# Patient Record
Sex: Female | Born: 1955 | Race: Black or African American | Hispanic: No | Marital: Single | Smoking: Current every day smoker
Health system: Southern US, Community
[De-identification: ages and names within clinical notes are randomized; demographics above are authoritative.]

## PROBLEM LIST (undated history)

## (undated) DIAGNOSIS — F419 Anxiety disorder, unspecified: Secondary | ICD-10-CM

## (undated) DIAGNOSIS — O24419 Gestational diabetes mellitus in pregnancy, unspecified control: Secondary | ICD-10-CM

## (undated) DIAGNOSIS — R5383 Other fatigue: Secondary | ICD-10-CM

## (undated) DIAGNOSIS — G43909 Migraine, unspecified, not intractable, without status migrainosus: Secondary | ICD-10-CM

## (undated) HISTORY — DX: Other fatigue: R53.83

## (undated) HISTORY — DX: Anxiety disorder, unspecified: F41.9

## (undated) HISTORY — DX: Gestational diabetes mellitus in pregnancy, unspecified control: O24.419

## (undated) HISTORY — DX: Migraine, unspecified, not intractable, without status migrainosus: G43.909

---

## 1998-12-08 ENCOUNTER — Other Ambulatory Visit: Payer: Self-pay | Admitting: Family Medicine

## 2001-12-06 ENCOUNTER — Encounter: Admitting: Family Medicine

## 2001-12-06 ENCOUNTER — Ambulatory Visit

## 2004-01-31 ENCOUNTER — Emergency Department: Payer: Self-pay | Admitting: Internal Medicine

## 2004-06-24 ENCOUNTER — Emergency Department: Payer: Self-pay | Admitting: Emergency Medicine

## 2005-03-12 ENCOUNTER — Other Ambulatory Visit: Payer: Self-pay | Admitting: Internal Medicine

## 2006-04-21 ENCOUNTER — Emergency Department: Payer: Self-pay | Admitting: Unknown Physician Specialty

## 2006-05-17 ENCOUNTER — Other Ambulatory Visit: Payer: Self-pay

## 2006-05-17 ENCOUNTER — Emergency Department: Payer: Self-pay | Admitting: Emergency Medicine

## 2006-09-09 ENCOUNTER — Emergency Department: Payer: Self-pay | Admitting: Emergency Medicine

## 2008-06-26 ENCOUNTER — Ambulatory Visit

## 2008-07-02 ENCOUNTER — Ambulatory Visit: Admitting: Family Medicine

## 2008-07-02 VITALS — BP 114/78 | HR 72 | Temp 98.2°F | Wt 149.0 lb

## 2008-07-02 MED ORDER — FLUOXETINE 20 MG TABLET: 1.0000 | ORAL_TABLET | Freq: Every day | ORAL | Status: DC

## 2008-07-02 NOTE — Nursing Note (Signed)
>>   Gevena Cotton, MA     Tue Jul 02, 2008 11:38 AM  Chief complaint noted,vital signs taken, allergies, screened for pain, med hx taken.   Tami N. Clouse, CMA

## 2008-07-02 NOTE — Progress Notes (Signed)
Chief Complaint   Patient presents with    Establish Care       Subjective:   Leah Campbell is a 53yr old female who presents for follow up establish    She  states that since onset few  year(s) ago, the symptoms are rapidly improving.  Associated symptoms include mild depression and fatigue has responded well to prozac.  Returned from [place] after 5 year transfer ;  Husband with multiple medical issues She has had similar symptoms in the past.    Menopausal symptoms doing well with hormones as per gynecologist   Needs refill    ROS:    Review of Systems -    Constitutional: negative.  Ears, Nose, Mouth, Throat: negative.  CV: negative.  Resp: negative.  GI: negative.  GU: negative.  Musculoskeletal: negative.  Endo: negative.  Derm:  negative      History:  No past medical history on file.    Objective: Physical exam   BP 114/78  Pulse 72  Temp(Src) 36.8 C (98.2 F) (Tympanic)  Wt 67.586 kg (149 lb)  General Appearance: Well nourished well hydrated patient   Alert, oriented and smiling  Eyes:  conjunctivae and corneas clear. PERRL, EOM's intact.   ENT: normal  Neck:  Neck supple. No adenopathy, thyroid symmetric, normal size,   Heart:  normal rate and regular rhythm, no murmurs, clicks, or gallops.  Lungs: clear to auscultation   Extremities:  no cyanosis, clubbing, or edema.  Skin:  negative.    Assessment:   627.9C Menopausal and postmenopausal disorder  (primary encounter diagnosis)  300.00E Anxiety     PLAN:   Orders Placed This Encounter    Estradiol-drospirenone (angeliq) 1-0.5 mg po tablet    Medroxyprogesterone (provera) 10 mg po tablet    Fluoxetine (prozac) 20 mg po tablet             History reviewed and updated.  Barriers to Learning assessed: none.  Patient verbalizes understanding of teaching and instructions.  An after visit summary was provided to the patient. The patient was instructed in all aspects of care and expressed comprehension.  Griffin Dakin, M.D.  [facility] [place]  [facility]

## 2008-07-03 ENCOUNTER — Encounter: Payer: Self-pay | Admitting: Family Medicine

## 2008-07-03 DIAGNOSIS — F419 Anxiety disorder, unspecified: Secondary | ICD-10-CM

## 2008-07-03 DIAGNOSIS — N959 Unspecified menopausal and perimenopausal disorder: Secondary | ICD-10-CM | POA: Insufficient documentation

## 2008-07-03 HISTORY — DX: Anxiety disorder, unspecified: F41.9

## 2008-12-06 ENCOUNTER — Telehealth: Payer: Self-pay | Admitting: Family Medicine

## 2008-12-06 MED ORDER — CYCLOBENZAPRINE 10 MG TABLET
ORAL_TABLET | ORAL | Status: AC
Start: 2008-12-06 — End: 2009-06-04

## 2008-12-06 NOTE — Telephone Encounter (Signed)
Patient states she was riding her bike yesterday and she thinks she may have pulled a muscle in her tail bone area. She is requesting a muscle relaxant. Offered appointment, patient declined. Requests to be sent to local pharmacy.    Leah Campbell [employer]

## 2008-12-06 NOTE — Telephone Encounter (Signed)
Nothing available , do you want to overbook?

## 2008-12-06 NOTE — Telephone Encounter (Signed)
Tami please call patient -    Flexeril faxed to pharmacy   Doc Kaylyn Lim

## 2008-12-06 NOTE — Telephone Encounter (Signed)
Patient needs to be evaluated to receive medication.

## 2008-12-06 NOTE — Telephone Encounter (Addendum)
I spoke with the patient about this.

## 2009-01-26 ENCOUNTER — Encounter: Payer: Self-pay | Admitting: Family Medicine

## 2009-03-04 ENCOUNTER — Ambulatory Visit

## 2009-03-06 ENCOUNTER — Encounter

## 2009-03-06 ENCOUNTER — Other Ambulatory Visit: Payer: Self-pay | Admitting: Nurse Practitioner

## 2009-03-06 ENCOUNTER — Encounter: Payer: Self-pay | Admitting: Family Medicine

## 2009-03-06 ENCOUNTER — Ambulatory Visit: Admitting: Family Medicine

## 2009-03-06 NOTE — Nursing Note (Signed)
>>   Gevena Cotton, MA     Thu Mar 06, 2009 11:09 AM  Chief complaint noted,vital signs taken, allergies, screened for pain, med hx taken.   Tami N. Clouse, CMA

## 2009-03-06 NOTE — Progress Notes (Signed)
Chief Complaint   Patient presents with    Fatigue     intermittent swelling around ankles x 6 weeks    Arm Problem     R-arm pain w/use         Subjective:   Leah Campbell is a 54yr old female who presents for a new problem of workout  With swelling and weight watchers. Water intake poor    She  states that since onset few week day(s) ago, the symptoms are gradually worsening.  Associated symptoms include fatigue, mild swelling lower extremity and episodes of lightheaded and dizziness .  She has not had similar symptoms in the past.    ROS:    Review of Systems -    Constitutional: negative.  Ears, Nose, Mouth, Throat: negative.  CV: negative.  Resp: negative.  GI: negative.  GU: negative.  Musculoskeletal: negative.  Endo: negative.  Derm:  negative      History:  Past Medical History:    FATIGUE                                                       Objective: Physical exam   BP 140/78  Pulse 78  Temp(Src) 36.3 C (97.4 F) (Tympanic)  General Appearance: Well nourished well hydrated patient   Alert, oriented and smiling  Eyes:  conjunctivae and corneas clear. PERRL, EOM's intact.   ENT: normal  Neck:  Neck supple. No adenopathy, thyroid symmetric, normal size,   Heart:  normal rate and regular rhythm, no murmurs, clicks, or gallops.  Lungs: clear to auscultation   Extremities:   Right elbow 2+ tender lateral epicondyle    FROM, no instability, pain with resisted extension of wrist.   Skin:  Minimal edema lower extremities    Assessment:   780.79B Fatigue  726.32AG Epicondylitis, lateral (tennis elbow)  251.2T Hypoglycemia     PLAN:   Eat 4-5 small meals per day to avoid low blood sugar  Tennis elbow strap    History reviewed and updated.  Barriers to Learning assessed: none.  Patient verbalizes understanding of teaching and instructions.  An after visit summary was provided to the patient. The patient was instructed in all aspects of care and expressed comprehension.    Electronically signed by   Billey Chang.  Kaylyn Lim, M.D.  [place] [facility] [employer]

## 2009-03-09 DIAGNOSIS — R5383 Other fatigue: Secondary | ICD-10-CM | POA: Insufficient documentation

## 2009-03-09 DIAGNOSIS — E162 Hypoglycemia, unspecified: Secondary | ICD-10-CM | POA: Insufficient documentation

## 2009-03-09 DIAGNOSIS — M7710 Lateral epicondylitis, unspecified elbow: Secondary | ICD-10-CM | POA: Insufficient documentation

## 2009-03-09 NOTE — Patient Instructions (Signed)
Index Spanish version Related topics   Low Blood Sugar (Hypoglycemia)   What is hypoglycemia?   Hypoglycemia means low blood sugar. It is usually a side effect of diabetes treatment. It can also result from other diseases or medicines, hormone or enzyme deficiencies, or tumors. A blood sugar lower than 70 that is not treated can be very dangerous. Sometimes hypoglycemia is called an insulin reaction or insulin shock.   How does it occur?   If you have diabetes and you have too much insulin or other diabetes medicine in your blood, your blood sugar level will become too low. Some other causes of abnormally low blood sugar levels are:    exercising more than usual    skipping or delaying meals or snacks    having a meal or snack that is too small    not taking medicines at the right time    drinking too much alcohol    diarrhea or vomiting.   Hypoglycemia is usually a side effect of diabetes treatment, but it can result from other medical conditions.   What are the symptoms?   The symptoms of low blood sugar range from mild to severe. Watch for the following symptoms that result from low blood sugar:    mild symptoms    dizziness    irritability    hunger    clumsiness    shakiness    sweating    fast heartbeat    moderate symptoms    confusion    headache    poor coordination    severe symptoms    seizures    unconsciousness    coma    death.   You must watch your blood sugar level closely. If you test your blood sugar regularly, you will be able to treat hypoglycemia before it causes serious symptoms.   Some high blood pressure medicines called beta blockers hide the symptoms of hypoglycemia. If you are taking medicine for high blood pressure, talk to your health care provider about this.   You should know the difference between the symptoms of low blood sugar (hypoglycemia) and high blood sugar (hyperglycemia). High blood sugar doesn't always cause symptoms, but when it does the symptoms may  include blurry vision, extreme thirst, and a lot of urination.   How is it treated?   If you often have symptoms of hypoglycemia, you should see your health care provider. Your provider can help you determine the cause. Your provider will also give you guidelines for treating low blood sugar when you are having symptoms. Here are some examples of such recommendations.   Always carry some form of sugar you can eat as soon as you have any symptoms of hypoglycemia. If you have mild or moderate hypoglycemia:    The following amounts and types of foods will bring your blood sugar level up:    1/2 cup [place] juice    1/3 cup apple juice    1/4 to 1/3 cup of raisins    several pieces of hard candy    4 to 6 ounces of regular soda (about half a can)    a tube of glucose in gel form (such as InstaGel or MonGel) or cake icing    1 tablespoon of molasses, corn syrup, or honey.    If you still have symptoms 10 to 15 minutes after eating or drinking one of the foods listed above, you may need to eat or drink another portion.    If  you are about to eat a meal, eat the fruit or drink the juice first and then eat the rest of your meal.   Fifteen to 20 minutes after treating low blood sugar, test your blood sugar level again.   If you have severe hypoglycemia that is causing seizures or unconsciousness, someone should call 911 because you need emergency treatment. Your blood sugar level will be checked and you will be given a shot of glucose or a hormone called glucagon to raise your blood sugar. You may need to go to the hospital so your health care provider can watch your reaction to treatment, determine why you had severe hypoglycemia, and, if necessary, change your medicine dosages.   How long will the effects last?   The effects of low blood sugar will continue and may even get worse until treatment restores your blood sugar level to normal. It may take several minutes after you start treatment for the symptoms to go  away. You will need to take special care the rest of your life to keep your blood sugar at the proper level.   How can I take care of myself?    Keep your blood sugar in the normal range. Check your blood sugar level regularly and whenever you have any of the symptoms of hypoglycemia. Know when to check your blood sugar and when to call for help. Ask your health care provider for guidelines to help you know when to call for help.    Carry sugar or hard candy to eat if your blood sugar gets too low.    Carry an ID (such as a card or bracelet) that says you have diabetes, in case of an emergency.    Be careful not to drive when your blood sugar is low. Driving with a low blood sugar is very dangerous, both for you and for others. The effect of a low blood sugar on reflexes and your ability to react are similar to those of a person driving while under the influence of alcohol. Always keep a quick source of sugar with you. Pull over to the side of the road right away if you begin to feel symptoms of low blood sugar and take your emergency sugar. Do not try to treat low blood sugar while you are driving.    If you are taking insulin, discuss with your health care provider whether you should carry the medicine glucagon with you at all times. A family member or friend can be taught how to inject it into your muscle if you become unconscious. After they give you the shot, they should call 911. The glucagon should raise your blood sugar enough for you to become conscious in a few minutes. Then, when you are awake enough, you can eat or drink something sweet, such as [place] juice. If you have an episode of unconsciousness from hypoglycemia, you may need to see your provider to determine why you developed hypoglycemia.   How can I prevent insulin-reaction hypoglycemia?    Check your blood sugar regularly.    Know what causes low blood sugar.    Eat a full meal at regular mealtimes. Do not delay or skip meals and do not  eat partial meals.    Take all medicines exactly as prescribed.    Check your blood sugar more often when you are exercising more or eating less, or when you are sick, according to your health care provider's recommendations.   Developed by Thana Ates Excell Seltzer,  RN, [place], and [employer].   Published by [employer].   Last modified: 2004-05-01   Last reviewed: 2004-03-18   This content is reviewed periodically and is subject to change as new health information becomes available. The information is intended to inform and educate and is not a replacement for medical evaluation, advice, diagnosis or treatment by a healthcare professional.   Adult Health Advisor 2006.2 Index  Adult Health Advisor 2006.2 Credits   Copyright  [employer] and/or one of [employer]. All Rights Reserved.     Index Spanish version Illustration       Lateral Epicondylitis (Tennis Elbow)   What is lateral epicondylitis (tennis elbow)?   Lateral epicondylitis (tennis elbow) is the name for a condition in which the bony bump at the outer side of the elbow is painful and tender.   The elbow joint is made up of the bone in the upper arm (humerus) and one of the bones in the lower arm (ulna). The bony bumps at the bottom of the humerus are called epicondyles. The bump on the outer side of the elbow, to which certain forearm muscles are attached by tendons, is called the lateral epicondyle.   Lateral epicondylitis is also referred to as wrist extensor tendonitis.   How does it occur?   Tennis elbow results from overusing the muscles in your forearm that straighten and raise your hand and wrist. When these muscles are overused, the tendons are repeatedly tugged at the point of attachment (the lateral epicondyle). As a result, the tendons become inflamed. Repeated, tiny tears in the tendon tissue cause pain. Among the activities that can cause tennis elbow are tennis and other racket sports,  carpentry, machine work, typing, and Archivist.   What are the symptoms?   The symptoms of tennis elbow are:    pain or tenderness on the outer side of the elbow    pain when you straighten or raise your wrist and hand    pain made worse by lifting a heavy object    pain when you make a fist, grip an object, shake hands, or turn door handles    pain that shoots from the elbow down into the forearm or up into the upper arm.   How is it diagnosed?   Your health care provider will ask you about your daily and recreational activities. He or she will examine your elbow and arm and will have you do movements that may cause pain in the outer part of your elbow. You may have x-rays of the elbow.   How is it treated?   Treatment includes the following:    Put an ice pack on your elbow for 20 to 30 minutes every 3 to 4 hours for 2 to 3 days or until the pain goes away.    You can also do ice massage. Massage your elbow with ice by freezing water in a Styrofoam cup. Peel the top of the cup away to expose the ice and hold onto the bottom of the cup while you rub the ice over your elbow for 5 to 10 minutes.    Wear a tennis elbow strap. This strap wraps around the forearm below the elbow and helps keep the forearm muscles from pulling on the painful epicondyle.    Take anti-inflammatory pain medicine, such as ibuprofen (Advil).    Do the exercises recommended by your health care provider. Your provider may also recommend physical therapy.  Your provider may recommend an injection of a corticosteroid medicine around the lateral epicondyle to reduce the inflammation.    In severe cases, surgery may be recommended.   You will need to avoid or reduce racket sports or other activities that involve repetitive motion of the elbow (hammering, unscrewing jars, or using a screwdriver) until your symptoms go away. Try to lift objects with your palm facing up to keep from overusing your lateral epicondyle.   How long will the  effects last?   The length of recovery depends on many factors such as your age and health, and if you have had a previous injury. Recovery time also depends on the severity of the injury. A mild injury may recover within a few weeks, whereas a severe injury may take 6 weeks or longer to recover. This problem can sometimes be long-lasting and can even come back once you are better. You need to stop doing the activities that cause pain until your elbow has healed. If you continue doing activities that cause pain, your symptoms will return and it will take longer to recover.   When can I return to my normal activities?   Everyone recovers from an injury at a different rate. Return to your activities will be determined by how soon your elbow recovers, not by how many days or weeks it has been since your injury has occurred. In general, the longer you have symptoms before you start treatment, the longer it will take to get better. The goal of rehabilitation is to return you to your normal activities as soon as is safely possible.   You may return to your sport or activity when you are able to forcefully grip things, like a tennis racket or golf club, or do activities such as working at a keyboard without pain in your elbow. It is important that there is no swelling around your injured elbow and that it has regained its normal strength compared to your uninjured elbow. You must have full range of motion of your elbow.   How can I take care of myself?   To help take care of yourself, follow the full treatment your health care provider prescribes. In addition, you can:    Get enough sleep and avoid becoming overtired.    Avoid painful activities, including racket sports, shaking hands, hammering, unscrewing jars, or using a screwdriver.   How can I prevent tennis elbow?   To prevent tennis elbow:    Use proper form during your activities, whether they are sports or job-related. For instance, be sure your tennis stroke  is correct and that your tennis racket has the proper grip size.    Warm up before playing tennis or doing other activities that involve your elbow or arm muscles. Gently stretch your elbow and arm muscles before and after exercise.    Ice your elbow after exercise or work.    In job-related activities, be sure your posture is correct and that the position of your arms during your work doesn't cause overuse of your elbow or arm muscles.   Written by Beatriz Stallion, M.D., for [facility] [employer].   Published by [employer].   Last modified: 2004-06-09   Last reviewed: 2002-02-20   This content is reviewed periodically and is subject to change as new health information becomes available. The information is intended to inform and educate and is not a replacement for medical evaluation, advice, diagnosis or treatment by a healthcare professional.  Adult Health Advisor 2006.2 Index  Adult Health Advisor 2006.2 Credits   Copyright  [employer] and/or one of [employer]. All Rights Reserved.         Index Illustration       Lateral Epicondylitis (Tennis Elbow)   Rehabilitation Exercises   You may do the stretching exercises right away. You may do the strengthening exercises when stretching is nearly painless.   Stretching exercises    Wrist range of motion: Bend your wrist forward and backward as far as you can. Do 3 sets of 10.    Wrist stretch: With your uninjured hand, help to bend the injured wrist down by pressing the back of your hand and holding it down for 15 to 30 seconds. Next, stretch the hand back by pressing the fingers in a backward direction and holding it for 15 to 30 seconds. Keep your elbow straight during this exercise. Do 3 sets.    Pronation and supination of the forearm: With your elbow bent 90, turn your palm upward and hold for 5 seconds. Slowly turn your palm downward and hold for 5 seconds. Make sure you keep your elbow at your side and  bent 90 throughout this exercise. Do 3 sets of 10.    Elbow range of motion: Gently bring your palm up toward your shoulder and bend your elbow as far as you can. Then straighten your elbow as far as you can 10 times. Do 3 sets of 10.   Strengthening exercises    Wrist flexion exercise: Hold a can or hammer handle in your hand with your palm facing up. Bend your wrist upward. Slowly lower the weight and return to the starting position. Do 3 sets of 10. Gradually increase the weight of the can or weight you are holding.    Wrist extension exercise: Hold a soup can or hammer handle in your hand with your palm facing down. Slowly bend your wrist upward. Slowly lower the weight down into the starting position. Do 3 sets of 10. Gradually increase the weight of the object you are holding.    Wrist radial deviation strengthening: Put your wrist in the sideways position with your thumb up. Hold a can of soup or a hammer handle and gently bend your wrist up, with the thumb reaching toward the ceiling. Slowly lower to the starting position. Do not move your forearm throughout this exercise. Do 3 sets of 10.    Forearm pronation and supination strengthening: Hold a soup can or hammer handle in your hand and bend your elbow 90. Slowly rotate your hand with your palm upward and then palm down. Do 3 sets of 10.    Wrist extension (with broom handle): Stand up and hold a broom handle in both hands. With your arms at shoulder level, elbows straight and palms down, roll the broom handle backward in your hand as if you are reeling something in using a broom handle. Do 3 sets of 10.   Written by Modesta Messing, MS, PT, for [facility] [employer].   Published by [employer].   Last modified: 2004-05-18   Last reviewed: 2002-02-20   This content is reviewed periodically and is subject to change as new health information becomes available. The information is intended to inform and educate and is not a  replacement for medical evaluation, advice, diagnosis or treatment by a healthcare professional.   Sports Medicine Advisor 2006.2 Index  Sports Medicine Advisor 2006.2 Credits   Copyright  2006 [employer] and/or one of [employer] All Rights Reserved.

## 2009-04-28 ENCOUNTER — Encounter: Payer: Self-pay | Admitting: Family Medicine

## 2009-04-28 ENCOUNTER — Ambulatory Visit: Admitting: Family Medicine

## 2009-04-28 ENCOUNTER — Ambulatory Visit

## 2009-04-28 VITALS — BP 136/74 | HR 96 | Wt 150.8 lb

## 2009-04-28 NOTE — Nursing Note (Signed)
>>   Gevena Cotton, MA     Mon Apr 28, 2009  4:22 PM  Chief complaint noted,vital signs taken, allergies, screened for pain, med hx taken.   Tami N. Clouse, CMA

## 2009-04-28 NOTE — Progress Notes (Signed)
Chief Complaint   Patient presents with    Irregular Heart Beat     Patient states she is having irregular heartbeat. Patient denies chest pain,nausea/vomiting,jaw or L-arm pain.         Subjective:   Leah Campbell is a 53yr old female who presents for a new problem of palpitations.    She  states that since onset few week(s) ago, the symptoms are gradually worsening.  Associated symptoms include palpitations;  Husband with a recurrence of papillary thyroid cancer.  She has had similar symptoms in the past.  Patient denies any exertional chest pain, dyspnea, palpitations, syncope, orthopnea, edema or paroxysmal nocturnal dyspnea.      ROS:    Review of Systems -    Constitutional: negative.  Ears, Nose, Mouth, Throat: negative.  CV: palpitations  Resp: negative.    History:  Past Medical History:    FATIGUE                                                       Objective: Physical exam   BP 172/90  Pulse 96  Wt 68.402 kg (150 lb 12.8 oz)  BP 136/74 by me   General Appearance: Well nourished well hydrated patient   Alert, oriented and smiling  Eyes:  conjunctivae and corneas clear. PERRL, EOM's intact.   ENT: normal  Neck:  Neck supple. No adenopathy, thyroid symmetric, normal size,   Heart:  normal rate and regular rhythm, no murmurs, clicks, or gallops.  Lungs: clear to auscultation   EKG RSR    Assessment:   785.1 Palpitations  (primary encounter diagnosis)  796.2A Transient hypertension     PLAN:   Echocardiogram and holter monitor  Monitor blood pressure Goal BP < or = 120/80        History reviewed and updated.  Barriers to Learning assessed: none.  Patient verbalizes understanding of teaching and instructions.  An after visit summary was provided to the patient. The patient was instructed in all aspects of care and expressed comprehension.    Electronically signed by   Billey Chang. Kaylyn Lim, M.D.  [place] [facility] [employer]

## 2009-05-03 ENCOUNTER — Encounter: Payer: Self-pay | Admitting: Family Medicine

## 2009-05-03 DIAGNOSIS — R002 Palpitations: Secondary | ICD-10-CM | POA: Insufficient documentation

## 2009-11-20 ENCOUNTER — Ambulatory Visit

## 2009-11-21 ENCOUNTER — Encounter: Payer: Self-pay | Admitting: Family Medicine

## 2009-11-21 ENCOUNTER — Ambulatory Visit: Admitting: Family Medicine

## 2009-11-21 VITALS — BP 120/60 | HR 84 | Temp 98.6°F | Wt 147.0 lb

## 2009-11-21 MED ORDER — PREDNISONE 10 MG TABLET
10.0000 mg | ORAL_TABLET | Freq: Every day | ORAL | Status: DC
Start: 2009-11-21 — End: 2010-05-27

## 2009-11-21 MED ORDER — ALBUTEROL SULFATE 2.5 MG/3 ML (0.083 %) SOLUTION FOR NEBULIZATION
2.5000 mg | INHALATION_SOLUTION | Freq: Four times a day (QID) | RESPIRATORY_TRACT | Status: AC | PRN
Start: 2009-11-21 — End: 2010-11-21

## 2009-11-21 MED ORDER — ALBUTEROL SULFATE HFA 90 MCG/ACTUATION AEROSOL INHALER
2.0000 | INHALATION_SPRAY | RESPIRATORY_TRACT | Status: DC | PRN
Start: 2009-11-21 — End: 2013-03-23

## 2009-11-21 MED ORDER — FLUTICASONE 220 MCG/ACTUATION AEROSOL ORAL INHALER
1.0000 | INHALATION_SPRAY | Freq: Two times a day (BID) | RESPIRATORY_TRACT | Status: AC
Start: 2009-11-21 — End: 2010-11-21

## 2009-11-21 MED ORDER — AZITHROMYCIN 250 MG TABLET
ORAL_TABLET | ORAL | Status: AC
Start: 2009-11-21 — End: 2009-11-26

## 2009-11-21 NOTE — Patient Instructions (Addendum)
Flovent:  Start with the albuterol 2 puffs in morning and evening wait 5-10 minutes then use the Flovent  inhaler 1 puffs three times a day and   rinse mouth with water after using.  After 5-7 days gradually reduce the puffs to 1 puff three times a day, the twice a day, then once a day.  If your cough or wheezing flare then up the dosage  You may use the albuterol (rescue inhaler) every 4 hours for wheezing or shortness of breath as necessary.  As the steroid inhaler gradually relieves the inflammation you will not need the albuterol inhaler as much because you will be breathing easier.  Index Spanish version

## 2009-11-21 NOTE — Progress Notes (Signed)
Chief Complaint   Patient presents with    Cough     fever 101.5,chest congestion x 2 days. Pt taking advil and mucinex      SUBJECTIVE:   Leah Campbell is a 24yr female who complains of sinus and nasal congestion, sore throat, nasal blockage, post nasal drip, fever, chest congestion, wheezing and dry cough for 2-3 days. She denies a history of sweats and denies a history of asthma. Patient denies smoke cigarettes.     OBJECTIVE:  She appears well, vital signs are as noted by the nurse. Ears normal.  Throat and pharynx normal.  Neck supple. No adenopathy in the neck. Nose is congested. Sinuses non tender. The chest with moderate end expiratory wheezes     ASSESSMENT:   493.90A Asthmatic bronchitis  (primary encounter diagnosis)     PLAN:  Orders Placed This Encounter    ALBUTEROL NEB TX 2.5MG  IN NS    Azithromycin (ZITHROMAX Z-PAK) 250 mg Tablet    Albuterol (PROAIR HFA, PROVENTIL HFA, VENTOLIN HFA) 90 mcg/Actuation inhaler    Fluticasone (FLOVENT HFA) 220 mcg/actuation Inhaler    PredniSONE (DELTASONE) 10 mg Tab Tablet    Albuterol (PROVENTIL, VENTOLIN) 2.5 mg /3 mL (0.083 %) nebulization         Symptomatic therapy suggested: push fluids, rest, use vaporizer or mist prn and ROV prn if symptoms persist or worsen. Call or return to clinic prn if these symptoms worsen or fail to improve as anticipated.    History reviewed and updated.  Barriers to Learning assessed: none.  Patient verbalizes understanding of teaching and instructions.  An after visit summary was provided to the patient. The patient was instructed in all aspects of care and expressed comprehension.    Electronically signed by   Billey Chang. Kaylyn Lim, M.D.   - [employer]

## 2009-11-21 NOTE — Nursing Note (Signed)
>>   Gevena Cotton, MA     Fri Nov 21, 2009 10:35 AM  Nebulizer treatment given per Dr. Arlyss Queen orders.    >> Gevena Cotton, [place]     Fri Nov 21, 2009  9:48 AM  Chief complaint noted,vital signs taken, allergies, screened for pain, med hx taken.   Tami N. Clouse, CMA

## 2009-11-23 DIAGNOSIS — J45909 Unspecified asthma, uncomplicated: Secondary | ICD-10-CM | POA: Insufficient documentation

## 2010-03-27 ENCOUNTER — Telehealth: Payer: Self-pay | Admitting: Family Medicine

## 2010-03-27 MED ORDER — OSELTAMIVIR 75 MG CAPSULE
75.0000 mg | ORAL_CAPSULE | Freq: Every day | ORAL | Status: AC
Start: 2010-03-27 — End: 2010-04-06

## 2010-03-27 NOTE — Telephone Encounter (Signed)
Lm to pt's vm regarding MD's message below

## 2010-03-27 NOTE — Telephone Encounter (Signed)
Patient does not have any symptoms. Prophylactic Tamiflu prescription sent.

## 2010-03-27 NOTE — Telephone Encounter (Signed)
Patient is calling states her husband was seen by Dr. Bobby Rumpf today for his flu and Dr. Bobby Rumpf prescribed Tamiflu for him and she would like Dr. Bobby Rumpf to call in Prescription for her as well.

## 2010-05-25 ENCOUNTER — Ambulatory Visit

## 2010-05-27 ENCOUNTER — Encounter: Payer: Self-pay | Admitting: Family Medicine

## 2010-05-27 ENCOUNTER — Ambulatory Visit: Admitting: Family Medicine

## 2010-05-27 MED ORDER — OLOPATADINE 0.1 % EYE DROPS
1.0000 [drp] | Freq: Two times a day (BID) | OPHTHALMIC | Status: AC
Start: 2010-05-27 — End: 2011-05-27

## 2010-05-27 NOTE — Progress Notes (Signed)
Chief Complaint   Patient presents with    Eye Problem     discharge,blury,itching,nasal congestion,chest congestion      SUBJECTIVE:   Leah Campbell is a 52yr female who complains of sinus and nasal congestion, sore throat, nasal blockage, post nasal drip, itching in eyes and clear nasal discharge for few days. She denies a history of fevers and has a history of asthma. Patient denies smoke cigarettes.     OBJECTIVE:  She appears well, vital signs are as noted by the nurse. Ears normal.  Throat and pharynx normal.  Neck supple. No adenopathy in the neck. Nose is congested. Sinuses non tender. The chest is clear, without wheezes or rales.    ASSESSMENT:    Allergic conjunctivitis   Allergic rhinitis   Asthma     PLAN:  Orders Placed This Encounter    Olopatadine (PATANOL) 0.1 % Ophthalmic Solution       Symptomatic therapy suggested: push fluids, rest, use vaporizer or mist prn and ROV prn if symptoms persist or worsen. Call or return to clinic prn if these symptoms worsen or fail to improve as anticipated.

## 2010-05-27 NOTE — Nursing Note (Signed)
>>   Gevena Cotton, MA     Wed May 27, 2010  2:03 PM  Chief complaint noted,vital signs taken, allergies, screened for pain, med hx taken.   Tami N. Clouse, CMA

## 2010-07-16 ENCOUNTER — Other Ambulatory Visit: Payer: Self-pay | Admitting: Nurse Practitioner

## 2010-07-16 ENCOUNTER — Ambulatory Visit

## 2010-07-23 ENCOUNTER — Ambulatory Visit

## 2010-07-24 ENCOUNTER — Encounter: Payer: Self-pay | Admitting: Family Medicine

## 2010-07-24 ENCOUNTER — Ambulatory Visit: Admitting: Family Medicine

## 2010-07-24 VITALS — BP 126/80 | HR 66

## 2010-07-24 NOTE — Nursing Note (Signed)
>>   Gevena Cotton, MA     Fri Jul 24, 2010  2:48 PM  Chief complaint noted,vital signs taken, allergies, screened for pain, med hx taken.   Tami N. Clouse, CMA

## 2010-07-28 NOTE — Progress Notes (Signed)
Chief Complaint   Patient presents with    Test Results       Subjective:   Leah Campbell is a 55yr old female who presents for follow up of anemia mild  Microscopic hematuria, low vitamin D .    She  states that since onset few month(s) ago, the symptoms are gradually improving.  Associated symptoms include fatigue, mild anemia.  She has had similar symptoms in the past.    ROS:    Review of Systems -    Constitutional: negative.  Ears, Nose, Mouth, Throat: negative.  CV: negative.  Resp: negative.  GI: negative.  GU: no flank pain  Musculoskeletal: negative.  Endo: negative.  Derm:  negative      History:  Past Medical History:    Fatigue                                                       Anxiety                                         07/03/2008     Objective: Physical exam   BP 126/80  Pulse 66  General Appearance: Well nourished well hydrated patient   Alert, oriented and smiling  Eyes:  conjunctivae and corneas clear. PERRL, EOM's intact.   ENT: normal  Neck:  Neck supple. No adenopathy, thyroid symmetric, normal size,   Heart:  normal rate and regular rhythm, no murmurs, clicks, or gallops.  Lungs: clear to auscultation   Abdomen: BS normal.  Abdomen soft, non-tender.  No masses or organomegaly.  Extremities:  no cyanosis, clubbing, or edema.  Skin:  negative.    Assessment:    Anemia  (primary encounter diagnosis)   Vitamin D deficiency   Hematuria   Special screening for malignant neoplasms, colon     PLAN:   Orders Placed This Encounter    US KIDNEY (RENAL), COMPLETE    CBC AUTO + REFLEX MANUAL DIFF    VITAMIN D, 25 HYDROXY    FERRITIN    GASTROENTEROLOGY REFERRAL            History reviewed and updated.  Barriers to Learning assessed: none.  Patient verbalizes understanding of teaching and instructions.  An after visit summary was provided to the patient. The patient was instructed in all aspects of care and expressed comprehension.    Electronically signed by   Billey Chang. Kaylyn Lim, M.D.  [place] -  [employer]

## 2010-07-30 ENCOUNTER — Ambulatory Visit: Admitting: Internal Medicine

## 2010-07-30 VITALS — BP 136/70 | HR 84 | Temp 99.6°F

## 2010-07-30 MED ORDER — AZITHROMYCIN 250 MG TABLET
ORAL_TABLET | ORAL | Status: AC
Start: 2010-07-30 — End: 2010-08-04

## 2010-07-30 NOTE — Progress Notes (Signed)
SUBJECTIVE:   Leah Campbell is a 7yr female who complains of sore throat for two days. She denies a history of nausea and vomiting and denies a history of asthma. Patient denies smoke cigarettes.  However, respiratory infection symptoms are gradually worse in the past 24 hours, developed chills, fevers. Very concerned for strep infection per patient.      OBJECTIVE:    BP 136/70  Pulse 84  Temp(Src) 37.6 C (99.6 F) (Tympanic)  She appears tired. Ears: canal clean, but TM bulging.  Oral pharynx revealed erythema with some exudates.  Neck supple. No adenopathy in the neck. The chest exam revealed no rhonchi. Heart exam: normal.     ASSESSMENT AND PLAN:   462 Acute pharyngitis  (primary encounter diagnosis)    Plan: POC RAPID STREP A negative             Counseling done. Symptoms are slightly worse, not better now. Will use antibiotic now. Benefits and risks of the use of antibiotic discussed with patient and antibiotic prescribed today. See Order Section. Symptomatic therapy suggested: push fluids, rest and ROV prn if symptoms persist or worsen. Call or return to clinic prn if these symptoms worsen or fail to improve as anticipated.    RTC as instructed.    I did review patient's past medical and family/social history and they are unchanged.   Barriers to Learning assessed: none. Patient verbalizes understanding of teaching and instructions.     Electronically Signed By:  Olene Floss, M.D.,Ph.D.  Associate Physician  [facility] [employer]  Phone: [phone]  3:34 PM

## 2010-07-30 NOTE — Nursing Note (Signed)
>>   Leah Campbell     Thu Jul 30, 2010  3:22 PM  Vital signs taken, allergies verified, screened for pain, med hx taken.  Clarita Crane, LVN

## 2010-08-12 ENCOUNTER — Ambulatory Visit: Admitting: Family Medicine

## 2010-08-12 ENCOUNTER — Encounter: Payer: Self-pay | Admitting: Family Medicine

## 2010-08-12 VITALS — BP 110/72 | HR 66 | Temp 98.4°F

## 2010-08-12 MED ORDER — AZITHROMYCIN 250 MG TABLET
ORAL_TABLET | ORAL | Status: AC
Start: 2010-08-12 — End: 2010-08-17

## 2010-08-12 NOTE — Progress Notes (Signed)
SUBJECTIVE:  Leah Campbell is a 55yr old female complains of sore throat with Griner spots x 2 days, right side only. Fatigue. Had similar symptoms 2 weeks ago. Better with zpak. No fever. No chills. No coughing. No sinus pain. No ear pain.     Past Medical History   Diagnosis Date    Fatigue     Anxiety 07/03/2008     OBJECTIVE:  General Appearance: healthy, alert, no distress, pleasant affect, cooperative.  Eyes:  conjunctivae and corneas clear. PERRL, EOM's intact. Fundi benign.  Ears:  normal TMs and canal.  Nose:  normal.  Mouth: mild tiny exudates right tonsils. Left side normal. No edema. No petechia.   No strawberry tongue.   Neck:  Neck supple. No adenopathy, thyroid symmetric, normal size.  Heart:  normal rate and regular rhythm, no murmurs, clicks, or gallops.  Lungs: clear to auscultation and percussion, no chest deformities noted.  Extremities:  no cyanosis, clubbing, or edema.  Skin:  negative.    ASSESSMENT:  462 Pharyngitis  (primary encounter diagnosis)  Comment:   Plan: POC RAPID STREP A, CULTURE BETA STREP ONLY        Zpack if not better 48 hours.         RST neg.     Barriers to Learning assessed: none. Patient verbalizes understanding of teaching and instructions.  Carlota Raspberry, DO

## 2010-08-12 NOTE — Nursing Note (Signed)
>>   Gevena Cotton, MA     Wed Aug 12, 2010  9:08 AM  Rapid strep test performed and charted per orders.    >> Gevena Cotton, [place]     Wed Aug 12, 2010  8:42 AM  Chief complaint noted,vital signs taken, allergies, screened for pain, med hx taken.   Tami N. Clouse, CMA

## 2010-08-24 ENCOUNTER — Ambulatory Visit

## 2010-08-24 ENCOUNTER — Encounter: Payer: Self-pay | Admitting: Family Medicine

## 2010-08-24 ENCOUNTER — Ambulatory Visit: Admitting: Family Medicine

## 2010-08-24 VITALS — BP 154/80 | HR 78

## 2010-08-24 NOTE — Nursing Note (Signed)
>>   Gevena Cotton, MA     Mon Aug 24, 2010  2:48 PM  Chief complaint noted,vital signs taken, allergies, screened for pain, med hx taken.   Tami N. Clouse, CMA

## 2010-08-24 NOTE — Progress Notes (Signed)
Chief Complaint   Patient presents with    Fatigue     swollen neck,headache,sore throat x 1 mo. Ongoing problem       Subjective:   Leah Campbell is a 55yr old female who presents for a new problem of sore throat.    She  states that since onset 1 month(s) ago, the symptoms are gradually worsening.  Associated symptoms include strept negative and zpack.  ? Chronically infected tonsil   Sore throat, fatigue,nausea, persistent fatigue.  Tried to work out   No fever She has not had similar symptoms in the past.    No history of fatigue husband with numerous medical issues    ROS:    Review of Systems -    Constitutional: negative.  Ears, Nose, Mouth, Throat: negative.  CV: negative.  Resp: negative.  GI: negative.  GU: negative.  Musculoskeletal: negative.  Endo: negative.  Derm:  negative      History:  Past Medical History:    Fatigue                                                       Anxiety                                         07/03/2008     Objective: Physical exam   BP 154/80  Pulse 78  General Appearance: Well nourished well hydrated patient   Alert, oriented and smiling  Eyes:  conjunctivae and corneas clear. PERRL, EOM's intact.   ENT: normal except minimal nodes  Neck:  Neck supple. No adenopathy, thyroid symmetric, normal size,   Heart:  normal rate and regular rhythm, no murmurs, clicks, or gallops.  Lungs: clear to auscultation   Abdomen: BS normal.  Abdomen soft, non-tender.  No masses or organomegaly.  No axillary nodes or inguinal node enlargement  Extremities:  no cyanosis, clubbing, or edema.  Skin:  negative.    Assessment:    Fatigue  (primary encounter diagnosis)   Cervical lymphadenitis     PLAN:  Orders Placed This Encounter    HETEROPHILE (MONO SCREEN)    EPSTEIN BARR ANTIBODY PROFILE    CBC AUTO + REFLEX MANUAL DIFF    SED RATE WESTERGREN           History reviewed and updated.  Barriers to Learning assessed: none.  Patient verbalizes understanding of teaching and instructions.  An  after visit summary was provided to the patient. The patient was instructed in all aspects of care and expressed comprehension.    Electronically signed by   Billey Chang. Kaylyn Lim, M.D.  [place] - [employer]

## 2010-08-26 ENCOUNTER — Encounter: Payer: Self-pay | Admitting: Family Medicine

## 2010-09-25 ENCOUNTER — Encounter: Payer: Self-pay | Admitting: Family Medicine

## 2010-10-28 ENCOUNTER — Ambulatory Visit

## 2010-11-04 ENCOUNTER — Ambulatory Visit: Admitting: Family Medicine

## 2010-11-20 ENCOUNTER — Telehealth: Payer: Self-pay | Admitting: Family Medicine

## 2010-11-20 NOTE — Telephone Encounter (Signed)
Husband report patient wife is anxious, panick attacks, lay in bed and sweat and sick to her stomach. Fearful. Physical and emotional symptoms. Patient feels heart racing sometimes.     Ok for Urgent care.     Carlota Raspberry, DO

## 2010-11-23 ENCOUNTER — Ambulatory Visit

## 2010-11-23 ENCOUNTER — Ambulatory Visit: Admitting: Family Medicine

## 2010-11-23 MED ORDER — CLONAZEPAM 0.25 MG DISINTEGRATING TABLET
DISINTEGRATING_TABLET | ORAL | Status: AC
Start: 2010-11-23 — End: 2011-02-23

## 2010-11-23 NOTE — Nursing Note (Signed)
>>   Maryruth Bun, MA     Mon Nov 23, 2010  2:17 PM  Vitals taken, allergies verified, screened for pain, Patient verified X2

## 2010-11-23 NOTE — Patient Instructions (Addendum)
Col[facility]   Tildon Husky, Ph.D., LCSW, [facility]  [address]  [address]  [place], [place]  [address]  ph:  [phone]  fax: [phone]    Developing and maintaining a healthy life-style will help you manage stress. Pay attention to how you react in stress-producing situations.  Think about alternatives.  To reduce the effects of stress, try some of the following techniques:  -Exercise for 30 minutes a minimum of three times a week.  -Change or eliminate upsetting situations, do things one at a time, and don't over schedule yourself.  -Take a relaxation break:  talk with friends;  listen to music you find peaceful and relaxing:  take a walk;  go to a movie;  do something new and different to break your usual behavior pattern.  -Learn to use relaxation techniques, such as mental imaging, diaphragmatic breathing, and progressive muscle relaxation.  -Get adequate, regular amounts of rest and sleep (6 to 10 hours a night).  -Eat three to six small, balanced meals a day.  -Minimize caffeine and alcohol consumption.  -Increase your fluid intake (six to eight glasses of water a day).  -Develop a positive outlook and sense of humor to overcome negative thoughts.  -Seek professional help when you are face with particularly stressful life experiences.

## 2010-11-23 NOTE — Progress Notes (Signed)
Chief Complaint   Patient presents with    Anxiety       Subjective:   Leah Campbell is a 55yr old female who presents for symptoms of  Anxiety.    She  states that since onset few week(s) ago, the symptoms are gradually worsening.  Associated symptoms include family stresses, and panic.  She has had similar symptoms in the past.  Anxiety: Overall, she has moderate symptoms that are gradually worsening. She is experiencing anxious mood and panic symptoms:(chest tightness/ choking, shortness of breath, sweats and lightheadedness), and she reports that none are improved. Psychosocial factors include: family/personal problems, ,specifically, parents need to go to home  She takes prozac works well  Side effects: none.  Overall, she is satisfied with current plan.  Unable to eat      ROS:    Review of Systems -    Constitutional: negative.  Ears, Nose, Mouth, Throat: negative.  CV: negative.  Resp: negative.  GI: negative.  GU: negative.  Musculoskeletal: negative.  Endo: negative.  Derm:  negative      History:  Past Medical History:    Fatigue                                                       Anxiety                                         07/03/2008     Objective: Physical exam   BP 140/82  Pulse 96  Wt 65.772 kg (145 lb)  General Appearance: Well nourished well hydrated patient   Alert, oriented and smiling  Eyes:  conjunctivae and corneas clear. PERRL, EOM's intact.   ENT: normal  Neck:  Neck supple. No adenopathy, thyroid symmetric, normal size,   Heart:  normal rate and regular rhythm, no murmurs, clicks, or gallops.  Lungs: clear to auscultation   Extremities:  no cyanosis, clubbing, or edema.  Skin:  negative.    Assessment:    Anxiety   Panic     PLAN:  Orders Placed This Encounter    Clonazepam (KLONOPIN) 0.25 mg Disintegrating Tablet       Restart Prozac      History reviewed and updated.  Barriers to Learning assessed: none.  Patient verbalizes understanding of teaching and instructions.  An after  visit summary was provided to the patient. The patient was instructed in all aspects of care and expressed comprehension.    Electronically signed by   Billey Chang. Kaylyn Lim, M.D.  [place] - [employer]

## 2010-11-24 ENCOUNTER — Telehealth: Payer: Self-pay | Admitting: Family Medicine

## 2010-11-24 MED ORDER — LORAZEPAM 0.5 MG TABLET
0.5000 mg | ORAL_TABLET | Freq: Two times a day (BID) | ORAL | Status: DC | PRN
Start: 2010-11-24 — End: 2013-06-22

## 2010-11-24 NOTE — Telephone Encounter (Signed)
Patient is calling states, she was prescribe with a Clonazepam (KLONOPIN) 0.25 mg Disintegrating Tablet . She tooked last night , and it didn't work for her. She is asking if she can be change to Xanax or Ativan . Please give her a call back.    Creston Klas   M[facility] II

## 2010-11-24 NOTE — Telephone Encounter (Signed)
Hold klonopin doesn't help very much  Will try ativan has used before  [facility]

## 2011-05-17 ENCOUNTER — Emergency Department: Payer: Self-pay | Admitting: Emergency Medicine

## 2011-09-06 ENCOUNTER — Emergency Department: Payer: Self-pay | Admitting: Emergency Medicine

## 2012-01-25 ENCOUNTER — Telehealth: Payer: Self-pay | Admitting: Family Medicine

## 2012-01-25 NOTE — Telephone Encounter (Signed)
Add patient on at 10:00am add on please  Doc Kaylyn Lim

## 2012-01-25 NOTE — Telephone Encounter (Signed)
Eliette fell off equipment at [employer] started having deja vu episode  Dream got off nausea unable to see   Brief loss of consciousness and head hit floor  Lab, Cat Scan of head   Doc Kaylyn Lim

## 2012-01-25 NOTE — Telephone Encounter (Signed)
Patient husband is calling states, patient was felt off the machine at the gym today, was taking to [facility] ER and diagnose with a Seizures. Patient husband said patient needs to be seen by Neurologist as soon as possible . He is asking for the referral. Please give them a call.    Leah Campbell   MOS[facility] II

## 2012-01-26 ENCOUNTER — Ambulatory Visit: Admitting: Family Medicine

## 2012-01-26 ENCOUNTER — Ambulatory Visit

## 2012-01-26 ENCOUNTER — Encounter: Payer: Self-pay | Admitting: Family Medicine

## 2012-01-26 VITALS — BP 142/72 | HR 84 | Temp 99.9°F

## 2012-01-26 NOTE — Patient Instructions (Addendum)
To check your hydration  Check your pulse after lying for 2 minutes and then stand and take pulse immediately the pulses should be the same  Check your pulse for 15 seconds then multiply times 4 = beats per minute  Or within 4 beats;  If higher then you are dehydrated  If pulse higher then need more water and electrolytes   If working or playing in the sun should replenish fluids with water and pretzels or chips or Gatorade or Powerade drinks.    Pulse lying is 80 pulse standing 92       PROCEDURE: CT BRAIN WO CONTRAST - , 01/25/2012 12:54 PM  ACCESSION NUMBER(S): [id number]  LOCATION:ERD  COMPARISON: None  CLINICAL INDICATION:S/P SYNCOPE ED LOBBY.  TECHNIQUE:  Axial noncontrast CT images were obtained from the skull base to  the vertex at 5 mm slice thickness in brain and bone algorithm.  FINDINGS:  Brain: No acute intracranial hemorrhage or large-territory  infarcts. No significant mass effect or midline shift. No  extra-axial fluid collections. The ventricles and basal cisterns  are within normal limits.  Orbits: Visualized portions are within normal limits.  Sinuses and Otomastoids:No mucosal thickening or air-fluid  levels.  Osseous Structures and Soft Tissues: Within normal limits.  IMPRESSION:  No acute intracranial findings radiographically.

## 2012-01-26 NOTE — Nursing Note (Signed)
>>   Leah Campbell     Wed Jan 26, 2012 10:16 AM  Attempted to obtain weight, patient decline    Patient roomed, chief complaint taken, allergies verified, vitals obtain, pain level noted and pharmacy verified.  Marjean Donna MA

## 2012-01-26 NOTE — Progress Notes (Signed)
Chief Complaint   Patient presents with    Other     Follow up ER visit       Subjective:   Leah Campbell is a 57yr old female who presents for follow up of faint spell yesterday.    She  states that since onset 1 day(s) ago, the symptoms are gradually improving.  Associated symptoms include   Leah Campbell fell off equipment at gymn  Elliptical started having deja vu episode  Dream got off nausea unable to see   Brief loss of consciousness and head hit floor    Lab, Cat Scan of head at [facility] .  She has not had similar symptoms in the past.    Petit mal age 57 years old    ROS:    Review of Systems -    Constitutional: negative.  Ears, Nose, Mouth, Throat: negative.  CV: negative.  Resp: negative.  GI: negative.  GU: negative.  Musculoskeletal: negative.  Endo: negative.  Derm:  negative      History:  Past Medical History:    Fatigue                                                       Anxiety                                         07/03/2008     Objective: Physical exam   BP 142/72  Pulse 84  Temp(Src) 37.7 C (99.9 F) (Tympanic)  Pulse lying is 80 pulse standing 92   General Appearance: Well nourished well hydrated patient   Alert, oriented and smiling  Eyes:  conjunctivae and corneas clear. PERRL, EOM's intact.   ENT: normal  Neck:  Neck supple. No adenopathy, thyroid symmetric, normal size,   Heart:  normal rate and regular rhythm, no murmurs, clicks, or gallops.  Lungs: clear to auscultation   Neurological Exam: normal without focal findings, mental status, speech normal, alert and oriented to person, place and time, PERLA, fundi are normal, cranial nerves 2-12 intact, muscle tone and strength normal and symmetric, reflexes normal and symmetric, sensation grossly normal, gait and station normal.  Rhomberg and tandem walk normal  Extremities:  no cyanosis, clubbing, or edema.  Skin:  negative.    Assessment:   Fainting spell  (primary encounter diagnosis)  Dehydration  Hypoglycemia   Comment:  I  do not feel patient has seizure disorder   ER doctor concerned so will order    PLAN:  Orders Placed This Encounter    EEG - ELECTROENCEPHALOGRAM, ROUTINE    Duloxetine (CYMBALTA) 60 mg capsule             History reviewed and updated.  Barriers to Learning assessed: none.  Patient verbalizes understanding of teaching and instructions.  An after visit summary was provided to the patient. The patient was instructed in all aspects of care and expressed comprehension.    Electronically signed by   Billey Chang. Kaylyn Lim, M.D.  [place] - [employer]

## 2012-01-28 ENCOUNTER — Encounter: Payer: Self-pay | Admitting: Family Medicine

## 2012-01-28 NOTE — Telephone Encounter (Signed)
From: Allyson Sabal  To: Griffin Dakin, MD  Sent: 01/27/2012 5:31 PM PST  Subject: Non-urgent Medical Advice Question    Hi Dr. Kaylyn Lim,   Is there any chance you were able to speak to the ER physician about my diagnosis after my fall on 01/25/2012?

## 2012-01-29 ENCOUNTER — Encounter: Payer: Self-pay | Admitting: Family Medicine

## 2012-01-29 DIAGNOSIS — E860 Dehydration: Secondary | ICD-10-CM | POA: Insufficient documentation

## 2012-01-29 NOTE — Progress Notes (Signed)
Placido Sou  is our referral coordinator   Would like to get EEG as soon as possible   Locally or at [facility] whichever first    [address]

## 2012-01-29 NOTE — Telephone Encounter (Signed)
From: Allyson Sabal  To: Griffin Dakin, MD  Sent: 01/28/2012 10:43 PM PST  Subject: Non-urgent Medical Advice Question    Are you setting the test up? How do I proceed ?

## 2012-02-02 ENCOUNTER — Ambulatory Visit

## 2012-02-04 ENCOUNTER — Ambulatory Visit: Admit: 2012-02-04 | Discharge: 2012-02-04

## 2012-02-04 NOTE — Procedures (Signed)
PATIENTBERLIN, Leah Campbell  MR #:  [id number]  DOB:  [birth year]  SEX:  F  AGE:  56  SERVICE DATE:  02/04/2012  REFERRING PHYSICIAN:  Griffin Dakin, MD    NEUROPHYSIOLOGY EEG REPORT    STUDY DURATION:    Start time is 8:57, stop time is 9:25.    STUDY NUMBER:    [id number].    IDENTIFICATION:    This is a 57 year old female with episodes of fainting as well as  episodes of deja vu sensation.    MEDICATIONS:    Cymbalta.    DESCRIPTION:    This is a sleep-deprived EEG.    AWAKE  Background: With the patient fully awake and aroused with eyes closed,  there is a symmetric 10-11 hertz dominant rhythm most predominant over  bilateral occipital head regions. This rhythm appropriately attenuates  with eye opening.  Beta:  Symmetric beta frequency waveforms are seen, most predominantly  over frontocentral regions    SLEEP  Throughout the recording the patient becomes drowsy and achieves stage  II sleep with vertex waves, spindles and positive occipital shart  transients (POSTs) present.    ACTIVATION:    Photic stimulation produced an appropriate driving response.  Hyperventilation caused normal slowing of background rhythms.    EKG:    A single lead EKG showed a regular rate and rhythm.    CLASSIFICATION:    Normal    IMPRESSION:    This is a normal awake and asleep EEG. No epileptiform activity or  areas of focal dysfunction were identified in the recording.    I have reviewed the original EEG tracings and agree with Dr. Catalina Pizza  interpretation.      Report Electronically Signed - 02/08/2012 16:26:31 by  Samuella Cota, MD  Resident  Neurology    Report Electronically Signed - 02/09/2012 08:41:09 by  Tyler Pita, MD  Department of Neurology    RP/MODL  D:  02/04/2012 18:59:42 PDT/PST  T:  02/04/2012 23:18:50 PDT/PST  Job #:  [id number] / [id number]

## 2012-02-05 ENCOUNTER — Encounter: Payer: Self-pay | Admitting: Family Medicine

## 2012-02-05 NOTE — Telephone Encounter (Signed)
From: Allyson Sabal  To: Griffin Dakin, MD  Sent: 02/04/2012 6:41 PM PST  Subject: Test Result Question    Hi Dr. Kaylyn Lim, I had the EEG today at 8 am, the tech said it would be read today also. Can you let me know as soon as possible what the results are and if I am still going to have to have Dan drive me everywhere? This is getting really really old:-)

## 2012-05-22 ENCOUNTER — Emergency Department: Payer: Self-pay | Admitting: Emergency Medicine

## 2012-05-22 LAB — BASIC METABOLIC PANEL
Anion Gap: 6 — ABNORMAL LOW (ref 7–16)
BUN: 15 mg/dL (ref 7–18)
Calcium, Total: 8.7 mg/dL (ref 8.5–10.1)
Chloride: 112 mmol/L — ABNORMAL HIGH (ref 98–107)
Co2: 25 mmol/L (ref 21–32)
Creatinine: 0.92 mg/dL (ref 0.60–1.30)
EGFR (African American): >60
Glucose: 98 mg/dL (ref 65–99)
Potassium: 3.4 mmol/L — ABNORMAL LOW (ref 3.5–5.1)
Sodium: 143 mmol/L (ref 136–145)

## 2012-05-22 LAB — CBC
HGB: 12.2 g/dL (ref 12.0–16.0)
MCH: 32.1 pg (ref 26.0–34.0)
MCHC: 33.5 g/dL (ref 32.0–36.0)
MCV: 96 fL (ref 80–100)
RBC: 3.78 10*6/uL — ABNORMAL LOW (ref 3.80–5.20)
RDW: 12.9 % (ref 11.5–14.5)
WBC: 7.5 10*3/uL (ref 3.6–11.0)

## 2012-09-25 ENCOUNTER — Other Ambulatory Visit: Payer: Self-pay

## 2012-09-30 ENCOUNTER — Other Ambulatory Visit: Payer: Self-pay

## 2012-10-07 ENCOUNTER — Other Ambulatory Visit: Payer: Self-pay

## 2012-10-21 ENCOUNTER — Other Ambulatory Visit: Payer: Self-pay

## 2012-12-02 ENCOUNTER — Other Ambulatory Visit: Payer: Self-pay

## 2013-01-09 ENCOUNTER — Telehealth: Payer: Self-pay | Admitting: Family Medicine

## 2013-01-09 NOTE — Telephone Encounter (Signed)
Let patient know if she has the flu, there is really no medication for it.  Advise symptomatic care of increase fluids, rest and ibuprofen as needed.  If she does not improve, have her followup at the urgent care in [place].  Despina Pole, M.D.

## 2013-01-09 NOTE — Telephone Encounter (Signed)
Patient notified

## 2013-01-09 NOTE — Telephone Encounter (Signed)
Patient is on a vacation in [place] and states that she has flu like symptoms (fever, sore thraot,headache,fatigue,body aches) and would like to have something sent to the pharmacy there. Please let patient know if this is possible.    Janae Sauce, [facility]  [place]

## 2013-03-23 ENCOUNTER — Telehealth: Payer: Self-pay | Admitting: Family Medicine

## 2013-03-23 MED ORDER — ALBUTEROL SULFATE HFA 90 MCG/ACTUATION AEROSOL INHALER
2.0000 | INHALATION_SPRAY | RESPIRATORY_TRACT | Status: AC | PRN
Start: 2013-03-23 — End: 2014-03-23

## 2013-03-23 NOTE — Telephone Encounter (Signed)
She is calling for a refill of Albuterol (PROAIR HFA, PROVENTIL HFA, VENTOLIN HFA) 90 mcg/Actuation inhaler. She requests Send to preferred pharmacy.

## 2013-05-07 ENCOUNTER — Telehealth: Payer: Self-pay | Admitting: Family Medicine

## 2013-05-07 NOTE — Telephone Encounter (Signed)
I have attempted to contact this patient by phone with the following results: left message to return my call on answering machine.  I do not see history of nasonex prescribed. I do see flonase, which is now OTC. Please ask the patient who prescribed the medication, if it is helping and if she requires advice or just a refill.

## 2013-05-07 NOTE — Telephone Encounter (Signed)
She is calling about nasal pressure. The problem started 3 day(s) ago, and is ongoing.    She has tried nasonex    She requests a refill prescription for nasonex. She is almost out of her old prescription and needs a new one.

## 2013-05-07 NOTE — Telephone Encounter (Signed)
Yes it was flonase.  Patient will try OTC.  She would also like to ask Dr. Kaylyn LimSutter if there is something better she could try for nasal pressure

## 2013-06-22 ENCOUNTER — Ambulatory Visit: Payer: BLUE CROSS/BLUE SHIELD | Admitting: Family Medicine

## 2013-06-22 ENCOUNTER — Encounter: Payer: Self-pay | Admitting: Family Medicine

## 2013-06-22 VITALS — BP 132/80

## 2013-06-22 DIAGNOSIS — R5381 Other malaise: Secondary | ICD-10-CM

## 2013-06-22 DIAGNOSIS — R7309 Other abnormal glucose: Secondary | ICD-10-CM

## 2013-06-22 DIAGNOSIS — R5383 Other fatigue: Principal | ICD-10-CM

## 2013-06-22 MED ORDER — LORAZEPAM 0.5 MG TABLET
0.5000 mg | ORAL_TABLET | Freq: Two times a day (BID) | ORAL | Status: DC | PRN
Start: 2013-06-22 — End: 2015-05-28

## 2013-06-22 NOTE — Progress Notes (Signed)
Chief Complaint   Patient presents with    Thyroid Problem       Subjective:   Leah Campbell is a 58yr old female who presents for a new problem of weight gain and weight lifting.    She  states that since onset few month(s) ago, the symptoms are gradually worsening.  Associated symptoms include   Occasionally fatigue, no dry skin or hair loss .  She has had similar symptoms in the past.    ROS:    Review of Systems -    Constitutional: negative.  Ears, Nose, Mouth, Throat: negative.  CV: negative.  Resp: negative.  GI: negative.  GU: negative.  Musculoskeletal: negative.  Endo: negative.  Derm:  negative      History:  Past Medical History:    Fatigue                                                       Anxiety                                         07/03/2008     Objective: Physical exam   BP 132/80  General Appearance: Well nourished well hydrated patient   Alert, oriented and smiling  Eyes:  conjunctivae and corneas clear. PERRL, EOM's intact.   ENT: normal  Neck:  Neck supple. No adenopathy, thyroid symmetric, normal size,   Heart:  normal rate and regular rhythm, no murmurs, clicks, or gallops.  Lungs: clear to auscultation   Abdomen: BS normal.  Abdomen soft, non-tender.  No masses or organomegaly.  Extremities:  no cyanosis, clubbing, or edema.  Skin:  Negative.  Your three month average blood sugar result,  Lab Results   Lab Name Value Date/Time    GLU 92 07/16/2010  9:50 AM     Thyroid  Lab Results   Lab Name Value Date/Time    TSH 1.34 07/16/2010  9:50 AM         Assessment:   Fatigue  (primary encounter diagnosis)  Abnormal glucose     PLAN:  Orders Placed This Encounter    THYROXINE, FREE (FREE T4)    THYROID ANTIBODIES    THYROID STIMULATING HORMONE    BASIC METABOLIC PANEL    HEMOGLOBIN A1C    Lorazepam (ATIVAN) 0.5 mg Tablet           History reviewed and updated.  Barriers to Learning assessed: none.  Patient verbalizes understanding of teaching and instructions.  An after  visit summary was provided to the patient. The patient was instructed in all aspects of care and expressed comprehension.    Electronically signed by   Billey Chang. Kaylyn Lim, M.D.  [place] [facility] [employer]

## 2013-06-22 NOTE — Nursing Note (Signed)
>>   Myriam Forehand, MA     Fri Jun 22, 2013 11:24 AM  Patient identified x 2, vitals taken, chief complaint identified, drug allergies verified, screened for pain, medications reviewed, pharmacy confirmed. Augusto Gamble, MA II

## 2013-06-24 ENCOUNTER — Encounter: Payer: Self-pay | Admitting: Family Medicine

## 2013-10-13 ENCOUNTER — Other Ambulatory Visit: Payer: Self-pay

## 2014-04-18 ENCOUNTER — Telehealth: Payer: Self-pay | Admitting: Ophthalmology

## 2014-04-18 NOTE — Telephone Encounter (Signed)
Per Gabriel RungJoe, pt is interested in blepharoplasty probably top and bottom lids consultation.    LVM TCB - need outside notes    Junius FinnerJosie Tran  Ophthalmology  [phone] ph  [phone] fax  [email or web address]

## 2014-05-22 ENCOUNTER — Ambulatory Visit: Payer: BLUE CROSS/BLUE SHIELD | Attending: Family Medicine | Admitting: Family Medicine

## 2014-05-22 ENCOUNTER — Encounter: Payer: Self-pay | Admitting: Family Medicine

## 2014-05-22 ENCOUNTER — Ambulatory Visit (INDEPENDENT_AMBULATORY_CARE_PROVIDER_SITE_OTHER): Payer: BLUE CROSS/BLUE SHIELD

## 2014-05-22 VITALS — BP 128/72 | HR 82 | Ht 64.0 in

## 2014-05-22 DIAGNOSIS — I10 Essential (primary) hypertension: Secondary | ICD-10-CM

## 2014-05-22 DIAGNOSIS — R5383 Other fatigue: Principal | ICD-10-CM | POA: Insufficient documentation

## 2014-05-22 DIAGNOSIS — E559 Vitamin D deficiency, unspecified: Secondary | ICD-10-CM | POA: Insufficient documentation

## 2014-05-22 DIAGNOSIS — R7309 Other abnormal glucose: Secondary | ICD-10-CM | POA: Insufficient documentation

## 2014-05-22 LAB — CBC WITH DIFFERENTIAL
BASOPHILS % AUTO: 0.5 %
BASOPHILS ABS AUTO: 0 10*3/uL (ref 0–0.2)
EOSINOPHIL % AUTO: 1.7 %
EOSINOPHIL ABS AUTO: 0.1 10*3/uL (ref 0–0.5)
HEMATOCRIT: 37 % (ref 36–46)
HEMOGLOBIN: 12.2 g/dL (ref 12.0–16.0)
LYMPHOCYTE ABS AUTO: 2.5 10*3/uL (ref 1.0–4.8)
LYMPHOCYTES % AUTO: 33.4 %
MCH: 28.5 pg (ref 27–33)
MCHC: 32.9 % (ref 32–36)
MCV: 86.6 UM3 (ref 80–100)
MONOCYTES % AUTO: 5.9 %
MONOCYTES ABS AUTO: 0.4 10*3/uL (ref 0.1–0.8)
MPV: 8.6 UM3 (ref 6.8–10.0)
NEUTROPHIL ABS AUTO: 4.4 10*3/uL (ref 1.80–7.70)
NEUTROPHILS % AUTO: 58.5 %
PLATELET COUNT: 256 10*3/uL (ref 130–400)
RDW: 13.2 U (ref 0–14.7)
RED CELL COUNT: 4.27 10*6/uL (ref 4.0–5.2)
WHITE BLOOD CELL COUNT: 7.4 10*3/uL (ref 4.5–11.0)

## 2014-05-22 LAB — LIPID PANEL WITH DLDL REFLEX
Cholesterol: 214 mg/dL — ABNORMAL HIGH (ref 0–200)
HDL Cholesterol: 73 mg/dL (ref >=35)
LDL Cholesterol Calculation: 132 mg/dL — ABNORMAL HIGH (ref <130)
Non-HDL Cholesterol: 141 mg/dL (ref 0–150)
Total Cholesterol:HDL Ratio: 2.9 (ref <4.0)
Triglyceride: 47 mg/dL (ref 35–160)

## 2014-05-22 LAB — URINALYSIS AND CULTURE IF IND
BILIRUBIN URINE: NEGATIVE
GLUCOSE URINE: NEGATIVE mg/dL
KETONES: NEGATIVE mg/dL
LEUK. ESTERASE: NEGATIVE
NITRITE URINE: NEGATIVE
PH URINE: 6 (ref 4.8–7.8)
PROTEIN URINE: NEGATIVE mg/dL
RBC: 1 /HPF (ref 0–5)
SPECIFIC GRAVITY: 1.005 (ref 1.002–1.030)
UROBILINOGEN.: NEGATIVE mg/dL (ref ?–2.0)

## 2014-05-22 LAB — COMPREHENSIVE METABOLIC PANEL
ALANINE TRANSFERASE (ALT): 19 U/L (ref 5–54)
ALBUMIN: 4.2 g/dL (ref 3.4–4.8)
ALKALINE PHOSPHATASE (ALP): 63 U/L (ref 35–115)
ASPARTATE TRANSAMINASE (AST): 21 U/L (ref 15–43)
BILIRUBIN TOTAL: 0.3 mg/dL (ref 0.3–1.3)
CALCIUM: 9.3 mg/dL (ref 8.6–10.5)
CARBON DIOXIDE TOTAL: 25 meq/L (ref 24–32)
CHLORIDE: 102 meq/L (ref 95–110)
CREATININE BLOOD: 0.79 mg/dL (ref 0.44–1.27)
GLUCOSE: 102 mg/dL — AB (ref 70–99)
POTASSIUM: 4.2 meq/L (ref 3.3–5.0)
PROTEIN: 7.2 g/dL (ref 6.3–8.3)
SODIUM: 135 meq/L (ref 135–145)
UREA NITROGEN, BLOOD (BUN): 14 mg/dL (ref 8–22)

## 2014-05-22 LAB — VITAMIN D, 25 HYDROXY: VITAMIN D, 25 HYDROXY: 34.5 ng/mL (ref 30.0–100.0)

## 2014-05-22 LAB — THYROID STIMULATING HORMONE: THYROID STIMULATING HORMONE: 1.06 u[IU]/mL (ref 0.35–3.30)

## 2014-05-22 LAB — THYROXINE, FREE (FREE T4): THYROXINE, FREE (FREE T4): 0.9 ng/dL (ref 0.56–1.64)

## 2014-05-22 MED ORDER — LISINOPRIL 10 MG TABLET
ORAL_TABLET | ORAL | Status: DC
Start: 2014-05-22 — End: 2017-09-16

## 2014-05-22 NOTE — Nursing Note (Signed)
Patient identified x 2, vitals taken, chief complaint identified, drug allergies verified, screened for pain, medications reviewed, pharmacy confirmed. Liz Angeli Demilio, MA II

## 2014-05-22 NOTE — Progress Notes (Signed)
Chief Complaint   Patient presents with    Thyroid Problem     "low"    Blood Pressure     "elevated"       Subjective:   Leah Campbell is a 59yr old female who presents for a new problem of concerned about hypertension.    She  states that since onset few month(s) ago, the symptoms are gradually worsening.  Associated symptoms include took blood pressure .  She has not had similar symptoms in the past.  Complains of head throbbing.  Had drank 3 glasses of red wine that evening     Gymn and run 4 miles, weights and yoga  Gained 9 lbs    Complains of fatigue for a few months   No symptoms of hypo or hyperthyroidism: , no feeling cold/chilly or excessively warm, no diarrhea or constipation, no undue sweatiness, anxiety or palpitations.     ROS:    Review of Systems -    Constitutional: negative.  Ears, Nose, Mouth, Throat: negative.  CV: negative.  Resp: negative.  GI: negative.  GU: negative.  Musculoskeletal: negative.  Endo: negative.  Derm:  negative      History:  Past Medical History:    Fatigue                                                       Anxiety                                         07/03/2008     Objective: Physical exam   BP 128/72 mmHg  Pulse 82  Ht 1.626 m (5\' 4" )  Wt    Right arm 144/84 by me   General Appearance: Well nourished well hydrated patient   Alert, oriented and smiling  Eyes:  conjunctivae and corneas clear. PERRL, EOM's intact.   ENT: normal  Neck:  Neck supple. No adenopathy, thyroid symmetric, normal size,   Heart:  normal rate and regular rhythm, no murmurs, clicks, or gallops.  Lungs: clear to auscultation   Extremities:  no cyanosis, clubbing, or edema.  Skin:  negative.      Assessment:   Other fatigue  (primary encounter diagnosis)  Essential hypertension  Vitamin D deficiency  Abnormal glucose     PLAN:  Orders Placed This Encounter    CBC WITH DIFFERENTIAL    THYROID STIMULATING HORMONE    VITAMIN D, 25 HYDROXY    LIPID PANEL WITH DLDL REFLEX    COMPREHENSIVE  METABOLIC PANEL    URINALYSIS AND CULTURE IF IND    HEMOGLOBIN A1C    Lisinopril (PRINIVIL, ZESTRIL) 10 mg Tablet           History reviewed and updated as well as pertinent lab and radiological studies   have been reviewed within EPIC.  This reviewed data may not be present within this   Progress note.   Barriers to Learning assessed: none.  Patient verbalizes understanding of teaching and instructions.  An after visit summary was provided to the patient. The patient was instructed in all aspects of care and expressed comprehension.    Electronically signed by   Billey Changharles W. Kaylyn LimSutter, M.D.  [place] [facility] [employer] [place]

## 2014-05-22 NOTE — Patient Instructions (Addendum)
High Blood Pressure: Care Instructions  Your Care Instructions  If your blood pressure is usually above 140/90, you have high blood pressure, or hypertension. Despite what a lot of people think, high blood pressure usually doesn't cause headaches or make you feel dizzy or lightheaded. It usually has no symptoms. But it does increase your risk for heart attack, stroke, and kidney or eye damage. The higher your blood pressure, the more your risk increases.  Your doctor will give you a goal for your blood pressure. Your goal will be based on your health and your age. An example of a goal is to keep your blood pressure below 140/90.  Lifestyle changes, such as eating healthy and being active, are always important to help lower blood pressure. You might also take medicine to reach your blood pressure goal.  Follow-up care is a key part of your treatment and safety. Be sure to make and go to all appointments, and call your doctor if you are having problems. It's also a good idea to know your test results and keep a list of the medicines you take.  How can you care for yourself at home?  Medical treatment   If you stop taking your medicine, your blood pressure will go back up. You may take one or more types of medicine to lower your blood pressure. Be safe with medicines. Take your medicine exactly as prescribed. Call your doctor if you think you are having a problem with your medicine.   Your doctor may suggest that you take one low-dose aspirin (81 mg) a day. This can help reduce your risk of having a stroke or heart attack.   See your doctor regularly. You may need to see the doctor more often at first or until your blood pressure comes down.   If you are taking blood pressure medicine, talk to your doctor before you take decongestants or anti-inflammatory medicine, such as ibuprofen. Some of these medicines can raise blood pressure.   Learn how to check your blood pressure at home.  Lifestyle changes   Stay at  a healthy weight. This is especially important if you put on weight around the waist. Losing even 10 pounds can help you lower your blood pressure.   If your doctor recommends it, get more exercise. Walking is a good choice. Bit by bit, increase the amount you walk every day. Try for at least 30 minutes on most days of the week. You also may want to swim, bike, or do other activities.   Avoid or limit alcohol. Talk to your doctor about whether you can drink any alcohol.   Try to limit how much sodium you eat to less than 2,300 milligrams (mg) a day. Your doctor may ask you to try to eat less than 1,500 mg a day.   Eat plenty of fruits (such as bananas and oranges), vegetables, legumes, whole grains, and low-fat dairy products.   Lower the amount of saturated fat in your diet. Saturated fat is found in animal products such as milk, cheese, and meat. Limiting these foods may help you lose weight and also lower your risk for heart disease.   Do not smoke. Smoking increases your risk for heart attack and stroke. If you need help quitting, talk to your doctor about stop-smoking programs and medicines. These can increase your chances of quitting for good.  When should you call for help?  Call your doctor now or seek immediate medical care if:   Your blood   pressure is much higher than normal (such as 180/110 or higher).   You think high blood pressure is causing symptoms such as:   Severe headache.   Blurry vision.  Watch closely for changes in your health, and be sure to contact your doctor if:   You do not get better as expected.   Where can you learn more?   Go to https://www.healthwise.net/patiented  Enter X567 in the search box to learn more about "High Blood Pressure: Care Instructions."    2006-2015 Healthwise, Incorporated. Care instructions adapted under license by [facility]. This care instruction is for use with your licensed healthcare professional. If you have questions about a medical  condition or this instruction, always ask your healthcare professional. Healthwise, Incorporated disclaims any warranty or liability for your use of this information.  Content Version: 10.6.465758; Current as of: March 09, 2013              DASH Diet: Care Instructions  Your Care Instructions  The DASH diet is an eating plan that can help lower your blood pressure. DASH stands for Dietary Approaches to Stop Hypertension. Hypertension is high blood pressure.  The DASH diet focuses on eating foods that are high in calcium, potassium, and magnesium. These nutrients can lower blood pressure. The foods that are highest in these nutrients are fruits, vegetables, low-fat dairy products, nuts, seeds, and legumes. But taking calcium, potassium, and magnesium supplements instead of eating foods that are high in those nutrients does not have the same effect. The DASH diet also includes whole grains, fish, and poultry.  The DASH diet is one of several lifestyle changes your doctor may recommend to lower your high blood pressure. Your doctor may also want you to decrease the amount of sodium in your diet. Lowering sodium while following the DASH diet can lower blood pressure even further than just the DASH diet alone.  Follow-up care is a key part of your treatment and safety. Be sure to make and go to all appointments, and call your doctor if you are having problems. It's also a good idea to know your test results and keep a list of the medicines you take.  How can you care for yourself at home?  Following the DASH diet   Eat 4 to 5 servings of fruit each day. A serving is 1 medium-sized piece of fruit,  cup chopped or canned fruit, 1/4 cup dried fruit, or 4 ounces ( cup) of fruit juice. Choose fruit more often than fruit juice.   Eat 4 to 5 servings of vegetables each day. A serving is 1 cup of lettuce or raw leafy vegetables,  cup of chopped or cooked vegetables, or 4 ounces ( cup) of vegetable juice. Choose  vegetables more often than vegetable juice.   Get 2 to 3 servings of low-fat and fat-free dairy each day. A serving is 8 ounces of milk, 1 cup of yogurt, or 1  ounces of cheese.   Eat 6 to 8 servings of grains each day. A serving is 1 slice of bread, 1 ounce of dry cereal, or  cup of cooked rice, pasta, or cooked cereal. Try to choose whole-grain products as much as possible.   Limit lean meat, poultry, and fish to 2 servings each day. A serving is 3 ounces, about the size of a deck of cards.   Eat 4 to 5 servings of nuts, seeds, and legumes (cooked dried beans, lentils, and split peas) each week. A   serving is 1/3 cup of nuts, 2 tablespoons of seeds, or  cup of cooked beans or peas.   Limit fats and oils to 2 to 3 servings each day. A serving is 1 teaspoon of vegetable oil or 2 tablespoons of salad dressing.   Limit sweets and added sugars to 5 servings or less a week. A serving is 1 tablespoon jelly or jam,  cup sorbet, or 1 cup of lemonade.   Eat less than 2,300 milligrams (mg) of sodium a day. If you have high blood pressure, diabetes, or chronic kidney disease, if you are African-American, or if you are older than age 57, try to limit the amount of sodium you eat to less than 1,500 mg a day.  Tips for success   Start small. Do not try to make dramatic changes to your diet all at once. You might feel that you are missing out on your favorite foods and then be more likely to not follow the plan. Make small changes, and stick with them. Once those changes become habit, add a few more changes.   Try some of the following:   Make it a goal to eat a fruit or vegetable at every meal and at snacks. This will make it easy to get the recommended amount of fruits and vegetables each day.   Try yogurt topped with fruit and nuts for a snack or healthy dessert.   Add lettuce, tomato, cucumber, and onion to sandwiches.   Combine a ready-made pizza crust with low-fat mozzarella cheese and lots of vegetable  toppings. Try using tomatoes, squash, spinach, broccoli, carrots, cauliflower, and onions.   Have a variety of cut-up vegetables with a low-fat dip as an appetizer instead of chips and dip.   Sprinkle sunflower seeds or chopped almonds over salads. Or try adding chopped walnuts or almonds to cooked vegetables.   Try some vegetarian meals using beans and peas. Add garbanzo or kidney beans to salads. Make burritos and tacos with mashed pinto beans or black beans.   Where can you learn more?   Go to [employer].cahttps://www.healthwise.net/patiented  Enter [id number] in the search box to learn more about "DASH Diet: Care Instructions."    2006-2015 Healthwise, Incorporated. Care instructions adapted under license by [facility]. This care instruction is for use with your licensed healthcare professional. If you have questions about a medical condition or this instruction, always ask your healthcare professional. Healthwise, Incorporated disclaims any warranty or liability for your use of this information.  Content Version: 10.6.465758; Current as of: March 09, 2013            I recommend the Omron blood pressure cuff.  It is very accurate and was rated #1 by Consumer Reports.  It can be purchased at [employer] for about $57.00    CNN did a special and interviewed 200 people who had  Lost 30lbs and kept it off for 1 year.  The following is a list :  Long term weight loss suggestions include:  1.  1 hour of aerobic exercise each day, such as walking,         riding a bike, using a treadmill, etc.  2.  Make exercise part of your daily activity:  park farther         from the front door at work, use the stairs at work,        take a walk at lunch or on breaks.  3.  Initially keep a diet diary  and monitor the number of        calories eaten per day - you may be surprised at how       many calories you are eating.       www.calorieking.com       goal 1200-1500 calories per day  4.  Eat four to five small meals per day  5.   Develop a permanent low fat diet with protein        and a moderate amount of carbohydrates.  A diet high        in fruits and vegetables and other high fiber foods  6.  Minimize intake of alcoholic beverages and sodas!!  7.  Drink at least six 8 ounce glasses of water per day  8.  Weight yourself frequently  9.  Encourage a friend or your significant other to participate

## 2014-05-23 ENCOUNTER — Ambulatory Visit: Payer: BLUE CROSS/BLUE SHIELD | Admitting: Ophthalmology

## 2014-05-23 LAB — HEMOGLOBIN A1C
HGB A1C,GLUCOSE EST AVG: 117 mg/dL
HGB A1C: 5.7 % — AB (ref 3.9–5.6)

## 2014-05-24 LAB — THYROID ANTIBODIES
THYROID PEROXIDASE (TPO) Ab: 13 [IU]/mL (ref <35)
Thyroglobulin Antibody: <20 [IU]/mL (ref <=40)

## 2014-05-28 ENCOUNTER — Encounter: Payer: Self-pay | Admitting: Family Medicine

## 2014-05-28 NOTE — Telephone Encounter (Signed)
From: Allyson SabalMary A Leeds  To: Griffin Dakinharles W Sutter, MD  Sent: 05/28/2014 8:11 AM PDT  Subject: Non-urgent Medical Advice Question    I seem to be missing the HbA1c results, glucose is high and LDL went quite a bit up in one year

## 2014-06-27 ENCOUNTER — Ambulatory Visit: Payer: BLUE CROSS/BLUE SHIELD | Admitting: Ophthalmology

## 2014-11-01 ENCOUNTER — Emergency Department
Admission: EM | Admit: 2014-11-01 | Discharge: 2014-11-01 | Disposition: A | Discharge status: Home / Self Care | Payer: Self-pay | Attending: Emergency Medicine | Admitting: Emergency Medicine

## 2014-11-01 ENCOUNTER — Encounter: Payer: Self-pay | Admitting: *Deleted

## 2014-11-01 DIAGNOSIS — Y9389 Activity, other specified: Secondary | ICD-10-CM | POA: Insufficient documentation

## 2014-11-01 DIAGNOSIS — S93492A Sprain of other ligament of left ankle, initial encounter: Secondary | ICD-10-CM | POA: Insufficient documentation

## 2014-11-01 DIAGNOSIS — W01198A Fall on same level from slipping, tripping and stumbling with subsequent striking against other object, initial encounter: Secondary | ICD-10-CM | POA: Insufficient documentation

## 2014-11-01 DIAGNOSIS — Y998 Other external cause status: Secondary | ICD-10-CM | POA: Insufficient documentation

## 2014-11-01 DIAGNOSIS — Y9289 Other specified places as the place of occurrence of the external cause: Secondary | ICD-10-CM | POA: Insufficient documentation

## 2014-11-01 DIAGNOSIS — Z720 Tobacco use: Secondary | ICD-10-CM | POA: Insufficient documentation

## 2014-11-01 MED ORDER — MELOXICAM 15 MG PO TABS: 15.0000 mg | ORAL_TABLET | Freq: Every day | ORAL | Status: DC

## 2014-11-01 NOTE — ED Provider Notes (Signed)
[facility] Emergency Department Provider Note  ____________________________________________  Time seen: Approximately 1:24 PM  I have reviewed the triage vital signs and the nursing notes.   HISTORY  Chief Complaint Fall    HPI Kari Warren is a 59 y.o. female who presents to the emergency department complaining of a residual left ankle pain status post fall week ago. She states that she tripped twisting her ankle and then inversion manner approximately 8 days ago. She states that she initially took over-the-counter medications with good relief and antibiotic "I was getting better." She states that over the intervening period in the evening after her work she reports increased edema to lateral left ankle. He states that edema will resolve by morning time and then reappear after being on foot. She states the pain is intermittent worse in the evening times. She states that the pain is in ache/burning sensation behind the left lateral malleolus. She denies any other complaints.   History reviewed. No pertinent past medical history.  There are no active problems to display for this patient.   History reviewed. No pertinent past surgical history.  Current Outpatient Rx  Name  Route  Sig  Dispense  Refill  . meloxicam (MOBIC) 15 MG tablet   Oral   Take 1 tablet (15 mg total) by mouth daily.   30 tablet   0     Allergies Review of patient's allergies indicates no known allergies.  No family history on file.  Social History Social History  Substance Use Topics  . Smoking status: Current Every Day Smoker  . Smokeless tobacco: None  . Alcohol Use: Yes    Review of Systems Constitutional: No fever/chills Eyes: No visual changes. ENT: No sore throat. Cardiovascular: Denies chest pain. Respiratory: Denies shortness of breath. Gastrointestinal: No abdominal pain.  No nausea, no vomiting.  No diarrhea.  No constipation. Genitourinary: Negative for  dysuria. Musculoskeletal: Negative for back pain. Endorses left ankle pain. Skin: Negative for rash. Neurological: Negative for headaches, focal weakness or numbness.  10-point ROS otherwise negative.  ____________________________________________   PHYSICAL EXAM:  VITAL SIGNS: ED Triage Vitals  Enc Vitals Group     BP 11/01/14 1220 118/79 mmHg     Pulse Rate 11/01/14 1220 79     Resp 11/01/14 1220 16     Temp 11/01/14 1220 98 F (36.7 C)     Temp src --      SpO2 11/01/14 1220 97 %     Weight 11/01/14 1220 189 lb (85.73 kg)     Height 11/01/14 1220  (1.702 m)     Head Cir --      Peak Flow --      Pain Score 11/01/14 1219 7     Pain Loc --      Pain Edu? --      Excl. in GC? --     Constitutional: Alert and oriented. Well appearing and in no acute distress. Eyes: Conjunctivae are normal. PERRL. EOMI. Head: Atraumatic. Nose: No congestion/rhinnorhea. Mouth/Throat: Mucous membranes are moist.  Oropharynx non-erythematous. Neck: No stridor.   Cardiovascular: Normal rate, regular rhythm. Grossly normal heart sounds.  Good peripheral circulation. Respiratory: Normal respiratory effort.  No retractions. Lungs CTAB. Gastrointestinal: Soft and nontender. No distention. No abdominal bruits. No CVA tenderness. Musculoskeletal: No lower extremity tenderness nor edema.  No joint effusions. No gross deformity noted to left ankle. Patient is tender to palpation post anterior bleed behind the lateral malleolus. Tenderness to  palpation over the Achilles tendon. He is to palpation of the fifth metatarsal. Full range of motion to the ankle elicits no pain. Palpation of the talonavicular joint is unremarkable. Dorsalis pedis pulse intact. Cap refill intact in toes and good sensation present. Neurologic:  Normal speech and language. No gross focal neurologic deficits are appreciated. No gait instability. Skin:  Skin is warm, dry and intact. No rash noted. Psychiatric: Mood and affect are  normal. Speech and behavior are normal.  ____________________________________________   LABS (all labs ordered are listed, but only abnormal results are displayed)  Labs Reviewed - No data to display ____________________________________________  EKG   ____________________________________________  RADIOLOGY   ____________________________________________   PROCEDURES  Procedure(s) performed: None  Critical Care performed: No  ____________________________________________   INITIAL IMPRESSION / ASSESSMENT AND PLAN / ED COURSE  Pertinent labs & imaging results that were available during my care of the patient were reviewed by me and considered in my medical decision making (see chart for details).  Patient's history, symptoms, and physical exam are consistent with posterior talofibular ankle sprain. Rest findings and diagnosis with patient. She verbalizes understanding of same. Advised the patient to use a stirrup ankle brace while at work. Use ice for any increased swelling. Take medications as prescribed. Do not take any other NSAIDs on this medication. Advised patient to follow up with orthopedics should symptoms persist past treatment course. Patient verbalizes understanding and compliance with treatment plan. ____________________________________________   FINAL CLINICAL IMPRESSION(S) / ED DIAGNOSES  Final diagnoses:  Sprain of posterior talofibular ligament of ankle, left, initial encounter      Racheal PatchesJonathan D Gardiner [place], PA-C 11/01/14 1336  Minna AntisKevin Paduchowski, MD 11/01/14 1523

## 2014-11-01 NOTE — Discharge Instructions (Signed)
Ankle Sprain °An ankle sprain is an injury to the strong, fibrous tissues (ligaments) that hold the bones of your ankle joint together.  °CAUSES °An ankle sprain is usually caused by a fall or by twisting your ankle. Ankle sprains most commonly occur when you step on the outer edge of your foot, and your ankle turns inward. People who participate in sports are more prone to these types of injuries.  °SYMPTOMS  °· Pain in your ankle. The pain may be present at rest or only when you are trying to stand or walk. °· Swelling. °· Bruising. Bruising may develop immediately or within 1 to 2 days after your injury. °· Difficulty standing or walking, particularly when turning corners or changing directions. °DIAGNOSIS  °Your caregiver will ask you details about your injury and perform a physical exam of your ankle to determine if you have an ankle sprain. During the physical exam, your caregiver will press on and apply pressure to specific areas of your foot and ankle. Your caregiver will try to move your ankle in certain ways. An X-ray exam may be done to be sure a bone was not broken or a ligament did not separate from one of the bones in your ankle (avulsion fracture).  °TREATMENT  °Certain types of braces can help stabilize your ankle. Your caregiver can make a recommendation for this. Your caregiver may recommend the use of medicine for pain. If your sprain is severe, your caregiver may refer you to a surgeon who helps to restore function to parts of your skeletal system (orthopedist) or a physical therapist. °HOME CARE INSTRUCTIONS  °· Apply ice to your injury for 1-2 days or as directed by your caregiver. Applying ice helps to reduce inflammation and pain. °· Put ice in a plastic bag. °· Place a towel between your skin and the bag. °· Leave the ice on for 15-20 minutes at a time, every 2 hours while you are awake. °· Only take over-the-counter or prescription medicines for pain, discomfort, or fever as directed by  your caregiver. °· Elevate your injured ankle above the level of your heart as much as possible for 2-3 days. °· If your caregiver recommends crutches, use them as instructed. Gradually put weight on the affected ankle. Continue to use crutches or a cane until you can walk without feeling pain in your ankle. °· If you have a plaster splint, wear the splint as directed by your caregiver. Do not rest it on anything harder than a pillow for the first 24 hours. Do not put weight on it. Do not get it wet. You may take it off to take a shower or bath. °· You may have been given an elastic bandage to wear around your ankle to provide support. If the elastic bandage is too tight (you have numbness or tingling in your foot or your foot becomes cold and blue), adjust the bandage to make it comfortable. °· If you have an air splint, you may blow more air into it or let air out to make it more comfortable. You may take your splint off at night and before taking a shower or bath. Wiggle your toes in the splint several times per day to decrease swelling. °SEEK MEDICAL CARE IF:  °· You have rapidly increasing bruising or swelling. °· Your toes feel extremely cold or you lose feeling in your foot. °· Your pain is not relieved with medicine. °SEEK IMMEDIATE MEDICAL CARE IF: °· Your toes are numb or blue. °·   You have severe pain that is increasing. °MAKE SURE YOU:  °· Understand these instructions. °· Will watch your condition. °· Will get help right away if you are not doing well or get worse. °  °This information is not intended to replace advice given to you by your health care provider. Make sure you discuss any questions you have with your health care provider. °  °Document Released: 01/04/2005 Document Revised: 01/25/2014 Document Reviewed: 01/16/2011 °Elsevier Interactive Patient Education ©2016 [employer] ° °Cryotherapy °Cryotherapy means treatment with cold. Ice or gel packs can be used to reduce both pain and swelling.  Ice is the most helpful within the first 24 to 48 hours after an injury or flare-up from overusing a muscle or joint. Sprains, strains, spasms, burning pain, shooting pain, and aches can all be eased with ice. Ice can also be used when recovering from surgery. Ice is effective, has very few side effects, and is safe for most people to use. °PRECAUTIONS  °Ice is not a safe treatment option for people with: °· Raynaud phenomenon. This is a condition affecting small blood vessels in the extremities. Exposure to cold may cause your problems to return. °· Cold hypersensitivity. There are many forms of cold hypersensitivity, including: °· Cold urticaria. Red, itchy hives appear on the skin when the tissues begin to warm after being iced. °· Cold erythema. This is a red, itchy rash caused by exposure to cold. °· Cold hemoglobinuria. Red blood cells break down when the tissues begin to warm after being iced. The hemoglobin that carry oxygen are passed into the urine because they cannot combine with blood proteins fast enough. °· Numbness or altered sensitivity in the area being iced. °If you have any of the following conditions, do not use ice until you have discussed cryotherapy with your caregiver: °· Heart conditions, such as arrhythmia, angina, or chronic heart disease. °· High blood pressure. °· Healing wounds or open skin in the area being iced. °· Current infections. °· Rheumatoid arthritis. °· Poor circulation. °· Diabetes. °Ice slows the blood flow in the region it is applied. This is beneficial when trying to stop inflamed tissues from spreading irritating chemicals to surrounding tissues. However, if you expose your skin to cold temperatures for too long or without the proper protection, you can damage your skin or nerves. Watch for signs of skin damage due to cold. °HOME CARE INSTRUCTIONS °Follow these tips to use ice and cold packs safely. °· Place a dry or damp towel between the ice and skin. A damp towel will  cool the skin more quickly, so you may need to shorten the time that the ice is used. °· For a more rapid response, add gentle compression to the ice. °· Ice for no more than 10 to 20 minutes at a time. The bonier the area you are icing, the less time it will take to get the benefits of ice. °· Check your skin after 5 minutes to make sure there are no signs of a poor response to cold or skin damage. °· Rest 20 minutes or more between uses. °· Once your skin is numb, you can end your treatment. You can test numbness by very lightly touching your skin. The touch should be so light that you do not see the skin dimple from the pressure of your fingertip. When using ice, most people will feel these normal sensations in this order: cold, burning, aching, and numbness. °· Do not use ice on someone who   cannot communicate their responses to pain, such as small children or people with dementia. °HOW TO MAKE AN ICE PACK °Ice packs are the most common way to use ice therapy. Other methods include ice massage, ice baths, and cryosprays. Muscle creams that cause a cold, tingly feeling do not offer the same benefits that ice offers and should not be used as a substitute unless recommended by your caregiver. °To make an ice pack, do one of the following: °· Place crushed ice or a bag of frozen vegetables in a sealable plastic bag. Squeeze out the excess air. Place this bag inside another plastic bag. Slide the bag into a pillowcase or place a damp towel between your skin and the bag. °· Mix 3 parts water with 1 part rubbing alcohol. Freeze the mixture in a sealable plastic bag. When you remove the mixture from the freezer, it will be slushy. Squeeze out the excess air. Place this bag inside another plastic bag. Slide the bag into a pillowcase or place a damp towel between your skin and the bag. °SEEK MEDICAL CARE IF: °· You develop Duffin spots on your skin. This may give the skin a blotchy (mottled) appearance. °· Your skin turns  blue or pale. °· Your skin becomes waxy or hard. °· Your swelling gets worse. °MAKE SURE YOU:  °· Understand these instructions. °· Will watch your condition. °· Will get help right away if you are not doing well or get worse. °  °This information is not intended to replace advice given to you by your health care provider. Make sure you discuss any questions you have with your health care provider. °  °Document Released: 08/31/2010 Document Revised: 01/25/2014 Document Reviewed: 08/31/2010 °Elsevier Interactive Patient Education ©2016 Elsevier Inc. ° °Stirrup Ankle Brace °Stirrup ankle braces give support and help stabilize the ankle joint. They are rigid pieces of plastic or fiberglass that go up both sides of the lower leg with the bottom of the stirrup fitting comfortably under the bottom of the instep of the foot. It can be held on with Velcro straps or an elastic wrap. Stirrup ankle braces are used to support the ankle following mild or moderate sprains or strains, or fractures after cast removal.  °They can be easily removed or adjusted if there is swelling. The rigid brace shells are designed to fit the ankle comfortably and provide the needed medial/lateral stabilization. This brace can be easily worn with most athletic shoes. The brace liner is usually made of a soft, comfortable gel-like material. This gel fits the ankle well without causing uncomfortable pressure points.  °IMPORTANCE OF ANKLE BRACES: °· The use of ankle bracing is effective in the prevention of ankle sprains. °· In athletes, the use of ankle bracing will offer protection and prevent further sprains. °· Research shows that a complete rehabilitation program needs to be included with external bracing. This includes range of motion and ankle strengthening exercises. Your caregivers will instruct you in this. °If you were given the brace today for a new injury, use the following home care instructions as a guide. °HOME CARE INSTRUCTIONS   °· Apply ice to the sore area for 15-20 minutes, 03-04 times per day while awake for the first 2 days. Put the ice in a plastic bag and place a towel between the bag of ice and your skin. Never place the ice pack directly on your skin. Be especially careful using ice on an elbow or knee or other bony area, such as   your ankle, because icing for too long may damage the nerves which are close to the surface. °· Keep your leg elevated when possible to lessen swelling. °· Wear your splint until you are seen for a follow-up examination. Do not put weight on it. Do not get it wet. You may take it off to take a shower or bath. °· For Activity: Use crutches with non-weight bearing for 1 week. Then, you may walk on your ankle as instructed. Start gradually with weight bearing on the affected ankle. °· Continue to use crutches or a cane until you can stand on your ankle without causing pain. °· Wiggle your toes in the splint several times per day if you are able. °· The splint is too tight if you have numbness, tingling, or if your foot becomes cold and blue. Adjust the straps or elastic bandage to make it comfortable. °· Only take over-the-counter or prescription medicines for pain, discomfort, or fever as directed by your caregiver. °SEEK IMMEDIATE MEDICAL CARE IF:  °· You have increased bruising, swelling or pain. °· Your toes are blue or cold and loosening the brace or wrap does not help. °· Your pain is not relieved with medicine. °MAKE SURE YOU:  °· Understand these instructions. °· Will watch your condition. °· Will get help right away if you are not doing well or get worse. °  °This information is not intended to replace advice given to you by your health care provider. Make sure you discuss any questions you have with your health care provider. °  °Document Released: 11/05/2003 Document Revised: 03/29/2011 Document Reviewed: 08/06/2014 °Elsevier Interactive Patient Education ©2016 Elsevier Inc. ° °

## 2014-11-01 NOTE — ED Notes (Signed)
Pt reports fall 8 days ago reports pain to right ankle, intermittent swelling. Ambulatory in triage.

## 2015-02-10 ENCOUNTER — Encounter: Payer: Self-pay | Admitting: Family Medicine

## 2015-02-10 ENCOUNTER — Ambulatory Visit: Payer: BLUE CROSS/BLUE SHIELD | Admitting: Family Medicine

## 2015-02-10 DIAGNOSIS — L280 Lichen simplex chronicus: Principal | ICD-10-CM

## 2015-02-10 DIAGNOSIS — Z818 Family history of other mental and behavioral disorders: Secondary | ICD-10-CM

## 2015-02-10 DIAGNOSIS — F419 Anxiety disorder, unspecified: Secondary | ICD-10-CM

## 2015-02-10 MED ORDER — HYDROXYZINE HCL 25 MG TABLET
25.0000 mg | ORAL_TABLET | Freq: Four times a day (QID) | ORAL | 1 refills | Status: AC | PRN
Start: 2015-02-10 — End: 2015-03-12

## 2015-02-10 MED ORDER — FLUOCINONIDE 0.05 % TOPICAL OINTMENT
TOPICAL_OINTMENT | Freq: Two times a day (BID) | TOPICAL | 0 refills | Status: AC
Start: 2015-02-10 — End: 2016-02-05

## 2015-02-10 NOTE — Patient Instructions (Signed)
Alcoholism leads to many medical problems:  cirrhosis, diabetes, neuropathy, ulcers,   I recommend attending AA meetings  There are many programs to help withdrawing from alcohol  Campral  two three times a days helps patients to get off alcohol    He states one of his patients was curious about a treatment plan that worked for him. ITs called [facility]. Non 12 step. [phone].    Clean and Sober   More 2 drinks cirrhosis

## 2015-02-10 NOTE — Progress Notes (Signed)
Chief Complaint   Patient presents with    Rash     itchy rash on back of neck     SUBJECTIVE:  Leah Campbell is a 60yr old female.   Complains of rash back of neck started with phantom itching  Feel like rash  Anxiety increased concerned about son, husband can drink excessively also  Stress severe;  Younger son 68 alcoholism Midwife   [place]     Review of Systems -   Neck     Objective:   There were no vitals taken for this visit.  General Appearance:healthy, alert, no distress, pleasant affect   Posterior neck with excoriations and moderate lichenification      ASSESSMENT:  Neurodermatitis  (primary encounter diagnosis)  Family history of stress  Anxiety     PLAN:  Orders Placed This Encounter    Fluoxetine (PROZAC) 20 mg tablet    HydrOXYzine (ATARAX) 25 mg Tablet as Hydrochloride    Fluocinonide (LIDEX) 0.05 % Ointment         History reviewed and updated as well as pertinent lab and radiological studies   have been reviewed within EPIC.  This reviewed data may not be present within this   Progress note.   Barriers to Learning assessed: none.  Patient verbalizes understanding of teaching and instructions.  An after visit summary was provided to the patient. The patient was instructed in all aspects of care and expressed comprehension.    Electronically signed by   Billey Chang. Kaylyn Lim, M.D.  [facility] [employer]

## 2015-02-10 NOTE — Nursing Note (Signed)
Vital signs taken, allergies verified, screened for pain. Darla Cowart, MAI

## 2015-02-13 ENCOUNTER — Encounter: Payer: Self-pay | Admitting: Family Medicine

## 2015-02-15 ENCOUNTER — Other Ambulatory Visit: Payer: Self-pay

## 2015-05-28 ENCOUNTER — Other Ambulatory Visit: Payer: Self-pay | Admitting: Family Medicine

## 2015-09-02 ENCOUNTER — Emergency Department
Admission: EM | Admit: 2015-09-02 | Discharge: 2015-09-02 | Disposition: A | Discharge status: Home / Self Care | Payer: No Typology Code available for payment source | Attending: Emergency Medicine | Admitting: Emergency Medicine

## 2015-09-02 ENCOUNTER — Emergency Department: Payer: No Typology Code available for payment source

## 2015-09-02 ENCOUNTER — Encounter: Payer: Self-pay | Admitting: Emergency Medicine

## 2015-09-02 DIAGNOSIS — Y999 Unspecified external cause status: Secondary | ICD-10-CM | POA: Insufficient documentation

## 2015-09-02 DIAGNOSIS — S39012A Strain of muscle, fascia and tendon of lower back, initial encounter: Secondary | ICD-10-CM | POA: Diagnosis not present

## 2015-09-02 DIAGNOSIS — Y92410 Unspecified street and highway as the place of occurrence of the external cause: Secondary | ICD-10-CM | POA: Diagnosis not present

## 2015-09-02 DIAGNOSIS — Y939 Activity, unspecified: Secondary | ICD-10-CM | POA: Diagnosis not present

## 2015-09-02 DIAGNOSIS — S161XXA Strain of muscle, fascia and tendon at neck level, initial encounter: Secondary | ICD-10-CM | POA: Diagnosis not present

## 2015-09-02 DIAGNOSIS — M542 Cervicalgia: Secondary | ICD-10-CM | POA: Diagnosis present

## 2015-09-02 DIAGNOSIS — F17200 Nicotine dependence, unspecified, uncomplicated: Secondary | ICD-10-CM | POA: Diagnosis not present

## 2015-09-02 MED ORDER — IBUPROFEN 800 MG PO TABS
800.0000 mg | ORAL_TABLET | Freq: Once | ORAL | Status: AC
  Administered 2015-09-02: 800 mg via ORAL
  Filled 2015-09-02: qty 1

## 2015-09-02 MED ORDER — NAPROXEN 500 MG PO TABS: 500.0000 mg | ORAL_TABLET | Freq: Two times a day (BID) | ORAL | 0 refills | Status: DC

## 2015-09-02 MED ORDER — CYCLOBENZAPRINE HCL 10 MG PO TABS: 10.0000 mg | ORAL_TABLET | Freq: Three times a day (TID) | ORAL | 0 refills | Status: DC | PRN
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
## 2015-09-03 ENCOUNTER — Emergency Department: Payer: No Typology Code available for payment source

## 2015-09-03 ENCOUNTER — Emergency Department
Admission: EM | Admit: 2015-09-03 | Discharge: 2015-09-03 | Disposition: A | Discharge status: Home / Self Care | Payer: No Typology Code available for payment source | Attending: Emergency Medicine | Admitting: Emergency Medicine

## 2015-09-03 DIAGNOSIS — Y92410 Unspecified street and highway as the place of occurrence of the external cause: Secondary | ICD-10-CM | POA: Insufficient documentation

## 2015-09-03 DIAGNOSIS — Y9389 Activity, other specified: Secondary | ICD-10-CM | POA: Insufficient documentation

## 2015-09-03 DIAGNOSIS — M542 Cervicalgia: Secondary | ICD-10-CM | POA: Diagnosis present

## 2015-09-03 DIAGNOSIS — F17200 Nicotine dependence, unspecified, uncomplicated: Secondary | ICD-10-CM | POA: Diagnosis not present

## 2015-09-03 DIAGNOSIS — Z79899 Other long term (current) drug therapy: Secondary | ICD-10-CM | POA: Insufficient documentation

## 2015-09-03 DIAGNOSIS — S161XXD Strain of muscle, fascia and tendon at neck level, subsequent encounter: Secondary | ICD-10-CM | POA: Insufficient documentation

## 2015-09-03 DIAGNOSIS — Y999 Unspecified external cause status: Secondary | ICD-10-CM | POA: Diagnosis not present

## 2015-09-03 MED ORDER — DIAZEPAM 5 MG PO TABS
5.0000 mg | ORAL_TABLET | Freq: Once | ORAL | Status: AC
  Administered 2015-09-03: 5 mg via ORAL
  Filled 2015-09-03: qty 1

## 2015-09-03 MED ORDER — HYDROCODONE-ACETAMINOPHEN 5-325 MG PO TABS: 1.0000 | ORAL_TABLET | ORAL | 0 refills | Status: DC | PRN
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
## 2015-09-03 NOTE — ED Notes (Signed)
Pt was not in room to sign papers but did take instructions from nursing station while this nurse was in another room
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
## 2015-09-03 NOTE — ED Notes (Signed)
States she feels a little better but still having pain to side of neck into shoulder

## 2015-09-03 NOTE — Discharge Instructions (Signed)
Continue taking your Naprosyn and Flexeril from yesterday's visit. Use warm moist heat as needed. Follow up with her primary care doctor return to the ER.

## 2015-09-17 ENCOUNTER — Emergency Department
Admission: EM | Admit: 2015-09-17 | Discharge: 2015-09-17 | Disposition: A | Discharge status: Home / Self Care | Payer: No Typology Code available for payment source | Attending: Emergency Medicine | Admitting: Emergency Medicine

## 2015-09-17 ENCOUNTER — Encounter: Payer: Self-pay | Admitting: *Deleted

## 2015-09-17 DIAGNOSIS — M5431 Sciatica, right side: Secondary | ICD-10-CM

## 2015-09-17 DIAGNOSIS — M5441 Lumbago with sciatica, right side: Secondary | ICD-10-CM | POA: Insufficient documentation

## 2015-09-17 DIAGNOSIS — M545 Low back pain: Secondary | ICD-10-CM | POA: Diagnosis present

## 2015-09-17 DIAGNOSIS — F17210 Nicotine dependence, cigarettes, uncomplicated: Secondary | ICD-10-CM | POA: Diagnosis not present

## 2015-09-17 DIAGNOSIS — Z791 Long term (current) use of non-steroidal anti-inflammatories (NSAID): Secondary | ICD-10-CM | POA: Diagnosis not present

## 2015-09-17 MED ORDER — TRAMADOL HCL 50 MG PO TABS: 50.0000 mg | ORAL_TABLET | Freq: Four times a day (QID) | ORAL | 0 refills | Status: DC | PRN

## 2015-09-17 MED ORDER — METHOCARBAMOL 750 MG PO TABS: 750.0000 mg | ORAL_TABLET | Freq: Four times a day (QID) | ORAL | 0 refills | Status: DC

## 2015-09-17 MED ORDER — METHYLPREDNISOLONE 4 MG PO TBPK: ORAL_TABLET | ORAL | 0 refills | Status: DC

## 2015-09-17 NOTE — Discharge Instructions (Signed)
Advised to get preauthorization for from adjuster before scheduled appointment with the clinic.
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
## 2015-09-17 NOTE — ED Triage Notes (Signed)
Pt complain of low back pain since yesterday with pain radiating down right leg, pt denies any other symptoms

## 2015-10-16 ENCOUNTER — Encounter: Payer: Self-pay | Admitting: Emergency Medicine

## 2015-10-16 ENCOUNTER — Emergency Department: Payer: No Typology Code available for payment source

## 2015-10-16 ENCOUNTER — Emergency Department
Admission: EM | Admit: 2015-10-16 | Discharge: 2015-10-16 | Disposition: A | Discharge status: Home / Self Care | Payer: No Typology Code available for payment source | Attending: Emergency Medicine | Admitting: Emergency Medicine

## 2015-10-16 DIAGNOSIS — F17210 Nicotine dependence, cigarettes, uncomplicated: Secondary | ICD-10-CM | POA: Insufficient documentation

## 2015-10-16 DIAGNOSIS — M47896 Other spondylosis, lumbar region: Secondary | ICD-10-CM

## 2015-10-16 DIAGNOSIS — M545 Low back pain: Secondary | ICD-10-CM | POA: Diagnosis present

## 2015-10-16 MED ORDER — IBUPROFEN 800 MG PO TABS
800.0000 mg | ORAL_TABLET | Freq: Three times a day (TID) | ORAL | 0 refills | Status: AC | PRN
Start: 2015-10-16 — End: ?

## 2015-10-16 MED ORDER — BACLOFEN 10 MG PO TABS: 10.0000 mg | ORAL_TABLET | Freq: Three times a day (TID) | ORAL | 0 refills | Status: AC

## 2015-10-16 NOTE — ED Triage Notes (Signed)
Pt with right lower back and right leg pain. Describes pain as shooting down leg from back. Was seen here for same 1 month ago. Pt did not follow-up with MD. States pain med we prescribed is no good.

## 2015-10-16 NOTE — ED Provider Notes (Signed)
[facility] Emergency Department Provider Note   ____________________________________________   None    (approximate)  I have reviewed the triage vital signs and the nursing notes.   HISTORY  Chief Complaint Leg Pain    HPI Kari Warren is a 60 y.o. female since for evaluation of right lower back pain and right leg pain. States that the pain started within her buttocks region and radiates down the lateral aspect of her right leg. Denies any trauma that she knows of.   History reviewed. No pertinent past medical history.  There are no active problems to display for this patient.   History reviewed. No pertinent surgical history.  Prior to Admission medications   Medication Sig Start Date End Date Taking? Authorizing Provider  baclofen (LIORESAL) 10 MG tablet Take 1 tablet (10 mg total) by mouth 3 (three) times daily. 10/16/15   Charmayne Sheer Aemilia Dedrick, PA-C  ibuprofen (ADVIL,MOTRIN) 800 MG tablet Take 1 tablet (800 mg total) by mouth every 8 (eight) hours as needed. 10/16/15   Evangeline Dakin, PA-C    Allergies Review of patient's allergies indicates no known allergies.  History reviewed. No pertinent family history.  Social History Social History  Substance Use Topics  . Smoking status: Current Every Day Smoker    Packs/day: 0.50    Types: Cigarettes  . Smokeless tobacco: Never Used  . Alcohol use Yes    Review of Systems Constitutional: No fever/chills Gastrointestinal: No abdominal pain.  No nausea, no vomiting.  No diarrhea.  No constipation. Genitourinary: Negative for dysuria. Musculoskeletal: Positive for right leg and buttocks pain. Skin: Negative for rash. Neurological: Negative for headaches, focal weakness or numbness.  10-point ROS otherwise negative.  ____________________________________________   PHYSICAL EXAM:  VITAL SIGNS: ED Triage Vitals  Enc Vitals Group     BP 10/16/15 1257 114/81     Pulse Rate 10/16/15 1257 93       Resp 10/16/15 1257 18     Temp 10/16/15 1257 97.7 F (36.5 C)     Temp Source 10/16/15 1257 Oral     SpO2 10/16/15 1257 98 %     Weight 10/16/15 1257 175 lb (79.4 kg)     Height 10/16/15 1257 5\' 7"  (1.702 m)     Head Circumference --      Peak Flow --      Pain Score 10/16/15 1308 10     Pain Loc --      Pain Edu? --      Excl. in GC? --     Constitutional: Alert and oriented. Well appearing and in no acute distress. Neck: No stridor.  Supple full range of motion nontender. Cardiovascular: Normal rate, regular rhythm. Grossly normal heart sounds.  Good peripheral circulation. Respiratory: Normal respiratory effort.  No retractions. Lungs CTAB. Musculoskeletal: No lower extremity tenderness nor edema.  No joint effusions. Neurologic:  Normal speech and language. No gross focal neurologic deficits are appreciated. No gait instability.Distally neurovascularly intact. Skin:  Skin is warm, dry and intact. No rash noted. Psychiatric: Mood and affect are normal. Speech and behavior are normal.  ____________________________________________   LABS (all labs ordered are listed, but only abnormal results are displayed)  Labs Reviewed - No data to display ____________________________________________  EKG   ____________________________________________  RADIOLOGY  No acute osseous findings. IMPRESSION:  Degenerative changes lumbar spine and both hips. Degenerative  changes are particularly severe about the right hip.    ____________________________________________   PROCEDURES  Procedure(s) performed: None  Procedures  Critical Care performed: No  ____________________________________________   INITIAL IMPRESSION / ASSESSMENT AND PLAN / ED COURSE  Pertinent labs & imaging results that were available during my care of the patient were reviewed by me and considered in my medical decision making (see chart for details).  Degenerative joint disease/degenerative disc  disease. Patient was placed on baclofen and ibuprofen. Patient follow-up with PCP or return to ER with any worsening symptomology.  Clinical Course     ____________________________________________   FINAL CLINICAL IMPRESSION(S) / ED DIAGNOSES  Final diagnoses:  Other osteoarthritis of spine, lumbar region      NEW MEDICATIONS STARTED DURING THIS VISIT:  New Prescriptions   BACLOFEN (LIORESAL) 10 MG TABLET    Take 1 tablet (10 mg total) by mouth 3 (three) times daily.   IBUPROFEN (ADVIL,MOTRIN) 800 MG TABLET    Take 1 tablet (800 mg total) by mouth every 8 (eight) hours as needed.     Note:  This document was prepared using Dragon voice recognition software and may include unintentional dictation errors.   Evangeline Dakinharles M Aracelly Tencza, PA-C 10/16/15 1446    Sharman CheekPhillip Stafford, MD 10/16/15 [phone]

## 2016-03-12 ENCOUNTER — Encounter: Payer: Self-pay | Admitting: Family Medicine

## 2016-03-12 ENCOUNTER — Ambulatory Visit: Payer: BLUE CROSS/BLUE SHIELD | Admitting: Family Medicine

## 2016-03-12 VITALS — BP 112/70 | HR 85 | Temp 97.1°F

## 2016-03-12 DIAGNOSIS — J111 Influenza due to unidentified influenza virus with other respiratory manifestations: Principal | ICD-10-CM

## 2016-03-12 MED ORDER — OSELTAMIVIR 75 MG CAPSULE
75.0000 mg | ORAL_CAPSULE | Freq: Two times a day (BID) | ORAL | 0 refills | Status: AC
Start: 2016-03-12 — End: 2016-03-17

## 2016-03-12 NOTE — Progress Notes (Signed)
Chief Complaint   Patient presents with    Flu     SUBJECTIVE:   Leah Campbell is a [age]yr female who present complaining of flu-like symptoms: myalgias, congestion, sore throat and cough for few days. Denies dyspnea or wheezing.    OBJECTIVE:  Appears moderately ill but not toxic; temperature as noted in vitals. Ears normal. Throat and pharynx normal.  Neck supple. No adenopathy in the neck. Sinuses non tender. The chest is clear.    ASSESSMENT:  Influenza  (primary encounter diagnosis)     PLAN:  Symptomatic therapy suggested: apply heating pad (don't sleep on pad) and call prn if symptoms persist or worsen. Call or return to clinic prn if these symptoms worsen or fail to improve as anticipated.     History reviewed and updated as well as pertinent lab and radiological studies   have been reviewed within EPIC.  This reviewed data may not be present within this   Progress note.   Barriers to Learning assessed: none.  Patient verbalizes understanding of teaching and instructions.  An after visit summary was provided to the patient. The patient was instructed in all aspects of care and expressed comprehension.    Electronically signed by   Billey Changharles W. Kaylyn LimSutter, M.D.  [place] [facility] [place]

## 2016-03-12 NOTE — Nursing Note (Signed)
Patient identified x 2, vitals taken, chief complaint identified, drug allergies verified, screened for pain, medications reviewed, pharmacy confirmed. Liz Julianny Milstein, MA II

## 2016-10-13 ENCOUNTER — Ambulatory Visit: Payer: BLUE CROSS/BLUE SHIELD | Admitting: OPHTHALMOLOGY

## 2016-10-27 ENCOUNTER — Ambulatory Visit: Payer: BLUE CROSS/BLUE SHIELD | Admitting: OPHTHALMOLOGY

## 2017-07-20 ENCOUNTER — Ambulatory Visit: Payer: BLUE CROSS/BLUE SHIELD | Attending: Optometrist | Admitting: Optometrist

## 2017-07-20 ENCOUNTER — Telehealth: Payer: Self-pay

## 2017-07-20 DIAGNOSIS — H43811 Vitreous degeneration, right eye: Principal | ICD-10-CM | POA: Insufficient documentation

## 2017-07-20 DIAGNOSIS — H35371 Puckering of macula, right eye: Secondary | ICD-10-CM | POA: Insufficient documentation

## 2017-07-20 NOTE — Telephone Encounter (Signed)
M.D. Name:  Beeper#:   Date: 07/20/2017    Last Appt:  Next Appt:   Referring Physician:  Beeper#:     Eye Problem: OD - right eye  How long? 1 week  Describe: She is seeing floaters and did have a small flash of light. Her vision is a little blurry and dim.    Any pain (stabbing, dull, achy) or gritty feelings in eye(s)? no  Describe:     Current Symptoms: flashes  floaters    Any change in vision?  Yes dim and blurry  Has patient been seen by physician for this problem?  no.  When:     Any treatment or prescription given to patient for this problem (eye drops, ointments, vision checked)?  no.  If yes, treatment/Rx:   Has patient ever had eye surgery?  no  Does patient wear glasses, bifocals or contact lens? Glasses and ctl  Presence of systemic disease such as hypertension, diabetes or coronary artery disease? no - If yes, which:     Currently using eye med? yes - If no, medication:     Additional information:

## 2017-07-20 NOTE — Progress Notes (Signed)
Chief Complaint   Patient presents with    General Exam     NP 62 year old female here for an eye exam. CC: Pt states she has been seeing flashes and floaters x 1 week.      Leah SabalMary A Campbell is a 62yr old female here for eye exam with the complaint of seeing a floater in her right eye. About a week ago she noticed couple of flashes of light in right eye, next day she noticed a floater in that eye. No more flashes of light, no curtains/veils in vision except that blurry floating spot.     POHx:  Contact lens wearer    FOHx:  AMD father    Patient Active Problem List   Diagnosis    Menopausal and postmenopausal disorder    Anxiety    Fatigue    Epicondylitis, lateral (tennis elbow)    Palpitations    Asthmatic bronchitis    Dehydration     Assessment/Plan:  1. Posterior vitreous detachment OD. No retinal tears or holes on dilated eye exam. Trace amounts of blood inferior vitreous. The patient was educated on the signs and symptoms of a retinal detachment including floaters, flashes of light, curtains or shadows,or a decrease in visual acuity. The patient expressed understanding and will call immediately if any of these symptoms occur.     2. Epiretinal membrane OD with small vessel tortuosity and dot hemes. OCT done today for documentation.     3. Few scattered blot hemes in nasal retina OD. Patient reports good blood sugar control, + Hx of gestational diabetes, no diabetes currently.    - takes Advil about once a week.     Referring to retinal specialists for evaluation     Leah Campbell, O.D., M.S.  Optometrist  [place] [facility]

## 2017-07-20 NOTE — Procedures (Signed)
Optos:  OD: thick central floater/Weiss ring, ERM sup/nasal to macula, few blot hemes nasal retina  OS: WNL    OCT:  OD: ERM with RNFL thickening sup/nasal to fovea  OS: small optically hollow spot     Refer to retina for follow up    Margaretha SheffieldPaul Zayveon Raschke, O.D., M.S.  Optometrist  [place] [facility]

## 2017-07-20 NOTE — Telephone Encounter (Signed)
Called and spoke with the patient, reports intermittent floaters, flashes of light OD X 1 week, more noticeable when she is wearing CL. She confirmed her appointment today 07/20/2017 at 1330 with [facility]    Raylene MiyamotoGerardo Courtland Reas, RN

## 2017-08-02 ENCOUNTER — Ambulatory Visit: Payer: BLUE CROSS/BLUE SHIELD | Attending: Ophthalmology | Admitting: Ophthalmology

## 2017-08-02 DIAGNOSIS — H43811 Vitreous degeneration, right eye: Secondary | ICD-10-CM

## 2017-08-02 DIAGNOSIS — H35371 Puckering of macula, right eye: Secondary | ICD-10-CM | POA: Insufficient documentation

## 2017-08-02 DIAGNOSIS — H43813 Vitreous degeneration, bilateral: Principal | ICD-10-CM | POA: Insufficient documentation

## 2017-08-02 NOTE — Progress Notes (Signed)
[facility]  VITREORETINAL SERVICE  NEW PATIENT NOTE    Referring doctor: Dr. Geradine GirtNefedov.     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 Patient saw Dr. Geradine GirtNefedov on 07/20/17; notes new flashes and floaters right eye x 3 weeks; improving today    POHx:  Contact lens wearer    FOHx:  AMD father       Family History:  No family history of age-related macular degeneration, retinal tears or detachment.    Studies  optical coherence tomography 08/02/2017  right eye; mild epiretinal membrane with traction extrafoveal  Left eye; normal    optos 08/02/2017  right eye; posterior vitreous detachment with epiretinal membrane   Left eye; normal    Retinal images reviewed and stored in [facility] of adequate quality     Impression:  1. Posterior vitreous detachment right eye  - symptoms x 3 weeks;   - no retinal break or vitreous hemorrhage   2. Epiretinal membrane OD   - visual acuity too good for pars planar vitrectomy  - optical coherence tomography shows no foveal traction today   3. Hypertensive retinopathy    Plan:  -Patient was instructed to call our office promptly should new flashes or floaters or change in vision be noted.   -Amsler grid mointoring  -improve HTN control.   - follow-up in 4 weeks or sooner prn; extend to 6 months if stable    Patient seen and examined.  Agree with above findings and recommendations as outlined together.    Electronically signed by  Chauncey MannSusanna S. Kaedyn Belardo, MD PhD  Attending, Vitreo-retinal Specialist  Professor of Ophthalmology

## 2017-08-02 NOTE — Progress Notes (Signed)
[facility]  VITREORETINAL SERVICE  NEW PATIENT FELLOW NOTE    Referring doctor: Dr. Geradine GirtNefedov.     Leah Campbell is a [age]yr old female who presents for evaluation of hemorrhagic PVD right eye, symptoms of floaters and epiretinal membrane right eye. Patient saw Dr. Geradine GirtNefedov on 07/20/17 with complaints of flashes and floaters for 1 week.     POHx:  Contact lens wearer    FOHx:  AMD father    Past Medical History:  Patient Active Problem List    Diagnosis Date Noted    Palpitations 05/03/2009     Priority: High    Dehydration 01/29/2012    Asthmatic bronchitis 11/23/2009    Fatigue 03/09/2009    Epicondylitis, lateral (tennis elbow) 03/09/2009    Menopausal and postmenopausal disorder 07/03/2008    Anxiety 07/03/2008        Allergies:  Allergies   Allergen Reactions    Penicillins Rash        Current Medications:  Current Outpatient Medications on File Prior to Visit   Medication Sig Dispense Refill    Estradiol-Drospirenone (ANGELIQ) 1-0.5 mg PO Tablet Take 1 Tab by mouth every day. 28 Tab 11    Fluoxetine (PROZAC) 20 mg tablet Take 1 tablet by mouth every morning. 30 tablet 1    Lisinopril (PRINIVIL, ZESTRIL) 10 mg Tablet 1 daily (blood pressure) 30 tablet 3    MedroxyPROGESTERone (PROVERA) 10 mg PO Tablet Take 1 Tab by mouth every day. 10 Each 0     No current facility-administered medications on file prior to visit.         Family History:  No family history of age-related macular degeneration, retinal tears or detachment.    Social History: non-contributory    Exam  See exam template    Studies      Impression:  1. Hemorrhagic Posterior vitreous detachment OD  2. Epiretinal membrane OD   3. Hypertensive retinopathy      Plan:  No breaks or tears seen on depressed exam right eye.  Continue to monitor epiretinal membrane right eye.  Patient endorses fluctuating blood pressure - suspect hypertensive retinopathy; emphasized need for good HTN control.     Deloris PingPhilip J. Glenroy Crossen MD  Retina Fellow  Pager  [phone]

## 2017-09-16 ENCOUNTER — Ambulatory Visit: Payer: BLUE CROSS/BLUE SHIELD | Attending: Internal Medicine | Admitting: Internal Medicine

## 2017-09-16 ENCOUNTER — Encounter: Payer: Self-pay | Admitting: Internal Medicine

## 2017-09-16 ENCOUNTER — Ambulatory Visit (INDEPENDENT_AMBULATORY_CARE_PROVIDER_SITE_OTHER): Payer: BLUE CROSS/BLUE SHIELD

## 2017-09-16 VITALS — BP 136/78 | HR 69

## 2017-09-16 DIAGNOSIS — Z0000 Encounter for general adult medical examination without abnormal findings: Principal | ICD-10-CM

## 2017-09-16 DIAGNOSIS — R5383 Other fatigue: Secondary | ICD-10-CM | POA: Insufficient documentation

## 2017-09-16 DIAGNOSIS — Z1239 Encounter for other screening for malignant neoplasm of breast: Secondary | ICD-10-CM

## 2017-09-16 DIAGNOSIS — R7309 Other abnormal glucose: Secondary | ICD-10-CM | POA: Insufficient documentation

## 2017-09-16 DIAGNOSIS — R030 Elevated blood-pressure reading, without diagnosis of hypertension: Secondary | ICD-10-CM | POA: Insufficient documentation

## 2017-09-16 DIAGNOSIS — Z1231 Encounter for screening mammogram for malignant neoplasm of breast: Secondary | ICD-10-CM | POA: Insufficient documentation

## 2017-09-16 LAB — CBC WITH DIFFERENTIAL
Basophils % Auto: 0.7 %
Basophils Abs Auto: 0.1 10*3/uL (ref 0.0–0.2)
Eosinophils % Auto: 1.9 %
Eosinophils Abs Auto: 0.1 10*3/uL (ref 0.0–0.5)
Hematocrit: 38.5 % (ref 36.0–46.0)
Hemoglobin: 12.9 g/dL (ref 12.0–16.0)
Lymphocytes % Auto: 33.4 %
Lymphocytes Abs Auto: 2.5 10*3/uL (ref 1.0–4.8)
MCH: 29 pg (ref 27.0–33.0)
MCHC: 33.4 % (ref 32.0–36.0)
MCV: 86.9 fL (ref 80.0–100.0)
MPV: 8.6 fL (ref 6.8–10.0)
Monocytes % Auto: 6.1 %
Monocytes Abs Auto: 0.5 10*3/uL (ref 0.1–0.8)
Neutrophils % Auto: 57.9 %
Neutrophils Abs Auto: 4.3 10*3/uL (ref 1.8–7.7)
Platelet Count: 265 10*3/uL (ref 130–400)
RDW: 13.7 % (ref 0.0–14.7)
Red Blood Cell Count: 4.43 10*6/uL (ref 4.00–5.20)
White Blood Cell Count: 7.4 10*3/uL (ref 4.5–11.0)

## 2017-09-16 LAB — LIPID PANEL WITH DLDL REFLEX
Cholesterol: 227 mg/dL — ABNORMAL HIGH (ref 0–200)
HDL Cholesterol: 66 mg/dL (ref >=35)
LDL Cholesterol Calculation: 143 mg/dL — ABNORMAL HIGH (ref <130)
Non-HDL Cholesterol: 161 mg/dL — ABNORMAL HIGH (ref <=150)
Total Cholesterol: HDL Ratio: 3.4 (ref <4.0)
Triglyceride: 92 mg/dL (ref 35–160)

## 2017-09-16 LAB — COMPREHENSIVE METABOLIC PANEL
Alanine Transferase (ALT): 36 U/L (ref 5–54)
Albumin: 4.4 g/dL (ref 3.4–4.8)
Alkaline Phosphatase (ALP): 64 U/L (ref 35–115)
Aspartate Transaminase (AST): 28 U/L (ref 15–43)
Bilirubin Total: 0.6 mg/dL (ref 0.3–1.3)
Calcium: 9.4 mg/dL (ref 8.6–10.5)
Carbon Dioxide Total: 25 mmol/L (ref 24–32)
Chloride: 104 mmol/L (ref 95–110)
Creatinine Serum: 0.84 mg/dL (ref 0.44–1.27)
E-GFR Creatinine (Male): 76 mL/min/{1.73_m2}
E-GFR, African American (Male): 88 mL/min/{1.73_m2}
Glucose: 87 mg/dL (ref 70–99)
Potassium: 4 mmol/L (ref 3.3–5.0)
Protein: 7.5 g/dL (ref 6.3–8.3)
Sodium: 141 mmol/L (ref 135–145)
Urea Nitrogen, Blood (BUN): 15 mg/dL (ref 8–22)

## 2017-09-16 LAB — TSH WITH FREE T4 REFLEX: Thyroid Stimulating Hormone: 1.25 u[IU]/mL (ref 0.35–3.30)

## 2017-09-16 LAB — MAGNESIUM (MG): Magnesium (Mg): 2.2 mg/dL (ref 1.5–2.6)

## 2017-09-16 LAB — HEMOGLOBIN A1C
Hgb A1C,Glucose Est Avg: 117 mg/dL
Hgb A1C: 5.7 % — ABNORMAL HIGH (ref 3.9–5.6)

## 2017-09-16 NOTE — Nursing Note (Signed)
Patient identified x 2, vitals taken, chief complaint identified, drug allergies verified, screened for pain, pharmacy confirmed.   Edwina Grossberg, MA I

## 2017-09-16 NOTE — Progress Notes (Signed)
Patient Name: Leah Campbell     Date: @DATE @  Age: 76yr      DOB: 10-Mar-1955       Chief Complaint   Patient presents with    Establish Care    Other     order labs     HPI:  Leah Campbell is a 62yr old female prior patient of Dr. Kaylyn Lim who presents today with complaint of establish care/physical  1) elevated blood pressure- has blood pressure cuff at home.  During the week not much wine but does wine tasting with husband.  2) health maintenance  3) struggles with weight loss-mother with hashimoto's.  4) fatigue-intermittent worse in the spring  5)depression episodic - takes fluoxetine 10mg  many days.   Sees psychiatrist every 2 years.  Other providers:  Dr. Suzan Slick  Dr. Idell Pickles- retinal specialist  tdap due to just had a new grandson.    Last colonoscopy-never    ROS:  Bowels are good, hips tight when running.  Allergies and asthma-sudafed and flonase  Rare ativan  Tapering down on hormones  Snores lightly  Fewer palpitations since exercising more    PATIENT HISTORY:  Patient Active Problem List   Diagnosis    Menopausal and postmenopausal disorder    Anxiety    Fatigue    Epicondylitis, lateral (tennis elbow)    Palpitations    Asthmatic bronchitis    Dehydration       Past Medical History:   Diagnosis Date    Anxiety 07/03/2008    Fatigue        No past surgical history on file.    Social History     Socioeconomic History    Marital status: MARRIED     Spouse name: Not on file    Number of children: Not on file    Years of education: Not on file    Highest education level: Not on file   Occupational History    Not on file   Social Needs    Financial resource strain: Not on file    Food insecurity:     Worry: Not on file     Inability: Not on file    Transportation needs:     Medical: Not on file     Non-medical: Not on file   Tobacco Use    Smoking status: Never Smoker    Smokeless tobacco: Never Used   Substance and Sexual Activity    Alcohol use: Yes    Drug use: No    Sexual  activity: Not on file   Lifestyle    Physical activity:     Days per week: Not on file     Minutes per session: Not on file    Stress: Not on file   Other Topics Concern    Not on file   Social History Narrative    Not on file       ALLERGIES:  Allergies   Allergen Reactions    Penicillins Rash       VITALS:  BP 136/78 (SITE: right arm, Orthostatic Position: sitting, Cuff Size: regular)   Pulse 69   SpO2 99%     PHYSICAL EXAM:  Physical Exam   Constitutional:  well-developed, well-nourished, and in no distress. No distress.   Neck: no cervical lymphadenopathy  No thyroid masses  Cardiovascular: Normal rate, regular rhythm and normal heart sounds.   No murmur heard.  Pulmonary/Chest: Effort normal and breath sounds normal.No wheezes. No rales.  Abdomen: nabs soft non-tender non distended.  Musculoskeletal:  no lower extremity edema.   Neurological:  alert.   Skin:  not diaphoretic.   dp patellar/bicep dtrs  ASSESSMENT/PLAN:  Medical/social/family history reviewed and updated  (R73.09) Elevated hemoglobin A1c  (primary encounter diagnosis)  Plan: HEMOGLOBIN A1C      (Z00.00) Annual physical exam  Labs ordered  Plan: LIPID PANEL WITH DLDL REFLEX, COMPREHENSIVE         METABOLIC PANEL, TSH WITH FREE T4 REFLEX, CBC         WITH DIFFERENTIAL            (R03.0) Elevated blood pressure reading without diagnosis of hypertension  Plan: MAGNESIUM (MG)            (R53.83) Fatigue, unspecified type  Comment: check for risk for future risk of thyroid disease  Plan: THYROID PEROXIDASE (TPO) AB            (Z00.00) Preventative health care  Comment: cologuard ordered. Got tdap due for flu shot in October.   Mammogram through [facility]    Patient Instructions   DASH  cologuard test will be ordered.   Get flu shot in early October    Mammogram is due and I ordered through [facility]  Labs today  Continue your exercise    Dietary recommedations (MIND diet/DASH diet):  Fish 1-2 times weekly  Berries/beans 3 x weekly at least (best  berries are the purple/blue color)-berries are low glycemic for fruits  cruciferous vegetables decrease colon cancer risk (broccoli/cauliflower).   Include at least 2 vegetables daily one green and one [place] or red.  Limit sugar  Limit or eliminate red meat  Avoid charring meat.  Lemon juice decreases carcinogen formation in barbequed meat. Consider putting in marinade.          Mateo FlowKarin Cathleen Amorie Rentz, MD   @SIGNATUREUSER @

## 2017-09-16 NOTE — Patient Instructions (Addendum)
DASH  cologuard test will be ordered.   Get flu shot in early October    Mammogram is due and I ordered through [facility]  Labs today  Continue your exercise    Dietary recommedations (MIND diet/DASH diet):  Fish 1-2 times weekly  Berries/beans 3 x weekly at least (best berries are the purple/blue color)-berries are low glycemic for fruits  cruciferous vegetables decrease colon cancer risk (broccoli/cauliflower).   Include at least 2 vegetables daily one green and one [place] or red.  Limit sugar  Limit or eliminate red meat  Avoid charring meat.  Lemon juice decreases carcinogen formation in barbequed meat. Consider putting in marinade.

## 2017-09-19 LAB — THYROID PEROXIDASE (TPO) AB: THYROID PEROXIDASE (TPO) Ab: 11 IU/mL (ref <35)

## 2017-10-13 ENCOUNTER — Encounter: Payer: Self-pay | Admitting: Internal Medicine
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
## 2017-12-08 ENCOUNTER — Telehealth: Payer: Self-pay | Admitting: Internal Medicine

## 2017-12-08 NOTE — Telephone Encounter (Signed)
Patient states she is leaving 12/12/17 and will be on a couple long flights. States at last appointment she discussed with PCP about getting a prescription for Ativan for her anxiety about flying.

## 2017-12-12 MED ORDER — LORAZEPAM 0.5 MG TABLET
0.5000 mg | ORAL_TABLET | ORAL | 0 refills | Status: AC | PRN
Start: 2017-12-12 — End: 2018-06-10

## 2017-12-12 NOTE — Telephone Encounter (Signed)
Left voice message that medication(s) has been sent

## 2017-12-12 NOTE — Telephone Encounter (Signed)
Ativan sent in. Can be taken once and repeated in 2-4 hours x 1 if needed on the plane. Do not take with alcohol. I know she is leaving today I hope she has time to get med before flight If not I can send into out of area pharm for trip home. KK

## 2018-07-24 ENCOUNTER — Telehealth: Payer: Self-pay | Admitting: Internal Medicine

## 2018-07-24 ENCOUNTER — Encounter: Payer: Self-pay | Admitting: Internal Medicine

## 2018-07-24 NOTE — Telephone Encounter (Signed)
Please advise patient that I sent her a message by my Chart. [facility] is not doing social testing only for those pending for procedures or with symptoms at this time. There are many other sites for testing for asymptomatic patients. KK

## 2018-07-24 NOTE — Telephone Encounter (Signed)
Patient informed via phone call. Patient verbalized understanding

## 2018-07-24 NOTE — Telephone Encounter (Signed)
Patient calling in regards to asking MD Jefm Miles if she can possibly order a covid test for her since ger grandchildren will be visiting and she wanted to make sure she was clear, patient states that she does not have any symptoms of covid but just wanted to have test done as precaution.

## 2018-12-22 ENCOUNTER — Encounter: Payer: Self-pay | Admitting: Internal Medicine

## 2018-12-22 NOTE — Telephone Encounter (Signed)
From: [name]  To: Nat Math, MD  Sent: 12/22/2018 4:03 PM PST  Subject: Non-urgent Medical Advice Question    Hello Dr. Jefm Miles,   I would really appreciate it if you would be able to take my husband Leah Campbell as a patient also. He has many health issues and needs a good Internist for primary care. He currently has Lymphoma [facility]) and is being treated for that through [place], and has a past issue with thyroid cancer and does not have a thyroid. We both had Dr. Judi Cong before he retired and Leah Campbell was assigned to Dr. Mariea Clonts but would prefer having you as per my recommendation.   Thank you, Hiram Comber

## 2019-02-19 ENCOUNTER — Encounter: Payer: Self-pay | Admitting: Internal Medicine

## 2019-02-19 NOTE — Telephone Encounter (Signed)
From: Allyson Sabal  To: Henrene Dodge, MD  Sent: 02/19/2019 4:19 PM PST  Subject: Non-urgent Medical Advice Question    Dr. Gwenlyn Perking,   I received the Pfizer vaccination for Covid one week ago. I had initial side effects of sore arm/fatigue for 48 hrs. Now I am having visibly swollen lymph nodes at the front of my neck and am still very tired. I am due for my 2nd dose in 2 weeks and was wondering if I should put that off or if I am ok to move forward.    Thank you,    Hattie Perch

## 2019-02-20 ENCOUNTER — Ambulatory Visit: Payer: BLUE CROSS/BLUE SHIELD | Admitting: Internal Medicine

## 2019-02-20 DIAGNOSIS — Z0000 Encounter for general adult medical examination without abnormal findings: Secondary | ICD-10-CM

## 2019-02-20 DIAGNOSIS — T50B95A Adverse effect of other viral vaccines, initial encounter: Secondary | ICD-10-CM

## 2019-02-20 NOTE — Progress Notes (Signed)
Answers for HPI/ROS submitted by the patient on 02/20/2019   PHQ-2 Score:: 1                                             Video Visit Note     I performed this clinical encounter by utilizing a real time telehealth video connection between my location and the patient's location. The patient's location was confirmed during this visit. I obtained verbal consent from the patient to perform this clinical encounter utilizing video and prepared the patient by answering any questions they had about the telehealth interaction.     Patient Name: Leah Campbell       DOB: [birth year]       MRN: [id number]   Date of appointment: 02/20/2019    History of Present Illness:  Leah Campbell is a 76yr, female who presents for a video visit from home for swollen lymph nodes and fatigue 1 week after the Pfizer vaccine. Fatigue is improving over the last few days and her swollen lymph nodes have not decreased in size. She does not have other symptoms. The lymph nodes are not sore.  Ob/gyn gyn ordered a mammogram and she has not yet done it.                        Patient Active Problem List   Diagnosis    Menopausal and postmenopausal disorder    Anxiety    Fatigue    Epicondylitis, lateral (tennis elbow)    Palpitations    Asthmatic bronchitis    Dehydration         Current Outpatient Medications:     drospirenone-estradiol (ANGELIQ) 0.25-0.5 mg Tablet, Takes just twice weekly, Disp: , Rfl:     Fluoxetine (PROZAC) 20 mg tablet, Take 0.5 tablets by mouth every morning., Disp: 30 tablet, Rfl: 1  Allergies:  Penicillins   Immunization History   Administered Date(s) Administered    Covid-19, Unspecified 02/12/2019    Tdap (Boostrix) 08/03/2017          OBJECTIVE: Constitutional: well-developed, cooperative, no distress  Psychiatric: alert, oriented X 3, appropriate mood and affect  I am unable to visualize the swollen lymph nodes looking at her neck in extension  HEENT: no conjunctivitis or scleral icterus  Chest: normal respiratory effort  Skin: no  jaundice or pallor      ASSESSMENT & PLAN:   (T50.B95A) Adverse effect of COVID-19 vaccine  (primary encounter diagnosis)  I have advised her that I expect the enlarged lymph nodes to start regressing within the next week and that there may be but is not always a recurrence with the second vaccine dose. If her lymph nodes do not resolve by the time she is due for the second dose I will do an econsult to allergy.     (Z00.00) Preventative health care  Comment: she will be planning mammogram and upcoming lab work.   Plan: Comprehensive Metabolic Panel, TSH with Free T4        Reflex, CBC with Differential, Lipid Panel with        DLDL Reflex, C-Reactive Protein              Approximately 12 minutes were spent with the patient additional 5 minutes spent reviewing data on swollen lymph nodes and formulating follow up plan.   The patient was  involved in the decision making process, and has agreed to the plan of care we developed during the video visit.  Barriers to Learning assessed: none      Max Sane MD  [employer] [facility]-Community Physician

## 2019-02-21 ENCOUNTER — Encounter: Payer: Self-pay | Admitting: Internal Medicine

## 2019-02-26 ENCOUNTER — Ambulatory Visit: Payer: BLUE CROSS/BLUE SHIELD | Admitting: Internal Medicine

## 2019-03-06 ENCOUNTER — Ambulatory Visit: Payer: BLUE CROSS/BLUE SHIELD | Admitting: Internal Medicine

## 2019-03-06 ENCOUNTER — Encounter: Payer: Self-pay | Admitting: Internal Medicine

## 2019-03-06 VITALS — BP 136/76 | HR 81 | Temp 97.7°F | Resp 16

## 2019-03-06 DIAGNOSIS — R682 Dry mouth, unspecified: Secondary | ICD-10-CM

## 2019-03-06 DIAGNOSIS — Z0000 Encounter for general adult medical examination without abnormal findings: Secondary | ICD-10-CM

## 2019-03-06 MED ORDER — HYDROXYZINE HCL 25 MG TABLET
25.0000 mg | ORAL_TABLET | Freq: Every day | ORAL | 1 refills | Status: DC | PRN
Start: 2019-03-06 — End: 2019-03-31

## 2019-03-06 MED ORDER — XYLITOL 550 MG MUCOSAL ADHESIVE TABLET
550.0000 mg | BUCCAL_TABLET | Freq: Three times a day (TID) | 1 refills | Status: AC | PRN
Start: 2019-03-06 — End: 2020-02-29

## 2019-03-06 NOTE — Patient Instructions (Addendum)
Warm compresses  Get xylimelts at the pharmacy   Let me know if you are not improving.  If not SSA SSB

## 2019-03-06 NOTE — Progress Notes (Addendum)
Chief Complaint   Patient presents with    Lymph Nodes, Swollen     s/p covid vaccine     SOL ODOR is a 64yr old female here for Pfizer vaccine was Sunday with terrible dry mouth after first vaccine and got worse after the second vaccine. Her lymph nodes in her neck went down the day before second shot but have swollen up again. She has some discomfort at the right side of her saw.   02/11/19 Pfizer and second 03/04/19.   She took a zyrtec yesterday and today.      Current Outpatient Medications on File Prior to Visit   Medication Sig Dispense Refill    drospirenone-estradiol (ANGELIQ) 0.25-0.5 mg Tablet Takes just twice weekly      Fluoxetine (PROZAC) 20 mg tablet Take 0.5 tablets by mouth every morning. 30 tablet 1     No current facility-administered medications on file prior to visit.       Vitals:    03/06/19 1450   BP: 136/76   SITE: right arm   Orthostatic Position: sitting   Cuff Size: regular   Pulse: 81   Resp: 16   Temp: 36.5 C (97.7 F)   TempSrc: Temporal   SpO2: 99%   well appearing in nad  Mildly swollen symmetric cervical lymph nodes  No swollen lymph node anterior to right ear.  No parotid tenderness no tenderness of submandibular glands. No palpable stones.  TMs clear  RRR no M  Clear lungs  No axillary lad  (R68.2) Dry mouth  (primary encounter diagnosis)  Warm compresses  (Z00.00) Preventative health care  Plan: Comprehensive Metabolic Panel, TSH with Free T4        Reflex, CBC with Differential, Lipid Panel with        DLDL Reflex, C-Reactive Protein            Patient and provider both wore surgical masks during the visit today. Provider wore a face shield as well.   Patient Instructions   Get xylimelts at the pharmacy   Let me know if you are not improving.  If not SSA SSB- has a high deductible plan      Marcello Moores MD  [facility] Internal Medicine-Community Physician

## 2019-03-06 NOTE — Nursing Note (Signed)
Patient identification verified by last name and date of birth.  Patient is here with self for lymphadenopathy s/p covid vaccines.  Since 2nd dose, patient feels salivary gland swelling.  After the FIRST dose, it was more in her throat, the lymph node problem, which resolved after 3 weeks.  Jefm Petty, MA I

## 2019-03-08 ENCOUNTER — Encounter: Payer: Self-pay | Admitting: Internal Medicine

## 2019-03-08 NOTE — Telephone Encounter (Signed)
From: Leah Campbell  To: Henrene Dodge, MD  Sent: 03/05/2019 9:55 AM PST  Subject: Visit Follow-up Question    Yesterday, February 14th I received my second dose of the Pfizer covid vaccine. After the first dose I as you know had lymph swelling. A few days after that I also had very reduced saliva flow. Now after my second vaccine I am having quite a bit of head, abdomen, lymph, etc swelling. I have requested an appointment for this week but was also wondering if there is something over the counter I can take to counteract all of this swelling. I do not have a fever but I am tired and have brain fog, no symptoms other than those.

## 2019-03-09 ENCOUNTER — Ambulatory Visit: Payer: BLUE CROSS/BLUE SHIELD | Admitting: Internal Medicine

## 2019-03-09 VITALS — BP 126/68 | HR 80 | Resp 16

## 2019-03-09 DIAGNOSIS — R682 Dry mouth, unspecified: Secondary | ICD-10-CM

## 2019-03-09 DIAGNOSIS — M3500 Sicca syndrome, unspecified: Secondary | ICD-10-CM

## 2019-03-09 MED ORDER — PREDNISONE 5 MG TABLET
ORAL_TABLET | ORAL | 0 refills | Status: AC
Start: 2019-03-09 — End: 2019-03-18

## 2019-03-09 NOTE — Progress Notes (Signed)
Chief Complaint   Patient presents with    Dry Mouth       Leah Campbell is a 64yr old female is back for follow up having last been seen 2/16. She had her second COVID vaccine on 2/14 and had dry mouth with the first vaccine which significantly worsened with the second vaccine. She had even abdominal swelling at one point but does not have this now. She did not get the xylimelts with her recent order. Her dry mouth is so severe that she was unable to sleep well and her swollen lymph nodes are also uncomfortable.     Current Outpatient Medications on File Prior to Visit   Medication Sig Dispense Refill    drospirenone-estradiol (ANGELIQ) 0.25-0.5 mg Tablet Takes just twice weekly      Fluoxetine (PROZAC) 20 mg tablet Take 0.5 tablets by mouth every morning. 30 tablet 1    HydrOXYzine (ATARAX) 25 mg Tablet as Hydrochloride Take 1 tablet by mouth every day at bedtime if needed for itching. Indications: insomnia 30 tablet 1    xylitoL (XYLIMELTS) 550 mg Muco-Adhesive Buccal Tablet 550 mg by Mucous Membrane route three times daily if needed. 100 tablet 1     No current facility-administered medications on file prior to visit.       Vitals:    03/09/19 1357   BP: 126/68   SITE: left arm   Orthostatic Position: sitting   Cuff Size: regular   Pulse: 80   Resp: 16     Well appearing in nad  Borderline swelling anterior to ears right > left symmetric mild cervical lad  Heart regular lungs clear   Belly not swollen  (M35.00) [place]ca, unspecified type (HCC)  (primary encounter diagnosis)  Ever since first COVID vaccine. Trial of warm compresses xylimelts and biotene rinse. If not adequate and continues to lose sleep from dry mouth trial of low dose prednisone. If not improving consider SSA/ SSB with future labs.   She knows that atarax and zyrtec can cause dry mouth but it has persisted after and preceded these medications  Patient and provider both wore surgical masks during the visit today. Provider wore a face shield as  well.   Patient Instructions   Biotene mouth wash   xylimelts to help with dry mouth  If dry mouth not improving okay to take 1-2 prednisone before 12pm to evaluate if helpful.       Marcello Moores MD  [facility] Internal Medicine-Community Physician

## 2019-03-09 NOTE — Telephone Encounter (Signed)
Patient on schedule for 1:40 pm today. Rolla Etienne, MA

## 2019-03-09 NOTE — Nursing Note (Signed)
Patient identification verified by last name and date of birth.  Patient is here with self.  Patient here for ongoing dry mouth/dry eye s/p covid vaccine.  Jefm Petty, MA I

## 2019-03-09 NOTE — Telephone Encounter (Signed)
Please overbook at 1:40pm as long as you can staff . KK

## 2019-03-09 NOTE — Patient Instructions (Signed)
Biotene mouth wash   xylimelts to help with dry mouth  If dry mouth not improving okay to take 1-2 prednisone before 12pm to evaluate if helpful.

## 2019-03-29 ENCOUNTER — Other Ambulatory Visit: Payer: Self-pay | Admitting: Internal Medicine

## 2019-03-29 NOTE — Telephone Encounter (Signed)
Last Visit: 03/09/19  Last Ordered: 03/06/19    Basilia Stuckert, MA

## 2019-04-16 ENCOUNTER — Encounter: Payer: Self-pay | Admitting: Internal Medicine

## 2019-04-17 ENCOUNTER — Telehealth: Payer: Self-pay | Admitting: Internal Medicine

## 2019-04-17 NOTE — Telephone Encounter (Signed)
3 patient identifiers used.  Per:   patient.    Disposition: Advised [place] or ER tonight. Pt refusing ER at this time. Pt states she will closely monitor for now and will go to ER tonight if symptoms worsen. Pt will call back tomorrow morning for same day apt.  Per:   patient verbalizes agreement to plan. Agrees to callback with any increase in symptoms/concerns or questions.    See Assessment Below  ClearTriage Note: Leah Campbell is a 64yr old female    10 days ago started having irregular heartbeats.  feels like a skipped beat and fluttering sensation.  Occurs a couple times a day, lasts for a few minutes.  Currently having palpitations. No dizziness or weakness. No chest pain. No shortness of breath.  HR is 80.    Protocol Used: Heart Rate and Heartbeat Questions (Adult)  Protocol-Based Disposition: See HCP within 4 Hours (or PCP Triage by Phone or Video)    Video visit not offered    Positive Triage Question:  * Age > 60 years (Exception: brief heart beat symptoms that went away and now feels well)    Care Advice Discussed:  * Reasons To Call Back      - You become worse.    Negative Triage Questions:  * Passed out (i.e., lost consciousness, collapsed and was not responding)  * Shock suspected (e.g., cold/pale/clammy skin, too weak to stand, low BP, rapid pulse)  * Difficult to awaken or acting confused (e.g., disoriented, slurred speech)  * Visible sweat on face or sweat dripping down face  * Unable to walk, or can only walk with assistance (e.g., requires support)  * [1] Received SHOCK from implantable cardiac defibrillator AND [2] persisting symptoms (i.e., palpitations, lightheadedness)  * [1] Dizziness, lightheadedness, or weakness AND [2] heart beating very rapidly (e.g., > 140 / minute)  * [1] Dizziness, lightheadedness, or weakness AND [2] heart beating very slowly  (e.g., < 50 / minute)  * Sounds like a life-threatening emergency to the triager  * Difficulty breathing  * Dizziness, lightheadedness, or  weakness  * [1] Heart beating very rapidly (e.g., > 140 / minute) AND [2] present now  (Exception: during exercise)  * Heart beating very slowly (e.g., < 50 / minute)  (Exception: athlete)  * New or worsened shortness of breath with activity (dyspnea on exertion)  * Patient sounds very sick or weak to the triager  * [1] Heart beating very rapidly (e.g., > 140 / minute) AND [2] not present now  (Exception: during exercise)  * [1] Skipped or extra beat(s) AND [2] increases with exercise or exertion  * [1] Skipped or extra beat(s) AND [2] occurs 4 or more times per minute  * New or worsened ankle swelling  * History of heart disease  (i.e., heart attack, bypass surgery, angina, angioplasty, CHF) (Exception: brief heart beat symptoms that went away and now feels well)    Jerral Bonito, RN  PCN Triage

## 2019-04-17 NOTE — Telephone Encounter (Signed)
Spoke with pt, see TE.    Chalsea Darko, RN  PCN Triage

## 2019-04-19 ENCOUNTER — Other Ambulatory Visit: Payer: Self-pay | Admitting: Internal Medicine

## 2019-04-19 ENCOUNTER — Telehealth: Payer: Self-pay | Admitting: Internal Medicine

## 2019-04-19 DIAGNOSIS — R002 Palpitations: Secondary | ICD-10-CM

## 2019-04-19 NOTE — Telephone Encounter (Signed)
Patient called to request same day appointment slot, patient states, as per Dr. Gwenlyn Perking request. No availability today 4/1, but we scheduled patient tomorrow 4/2 @11  am. Up -dating Dr. in case its necessary to try and find an availability for today 4/1, as patient is dealing with Heart Arythmia.  Contact # for patient   [phone]  Thank you,  Amy [phone] II Patient Contact Center  Phone: [phone]

## 2019-04-19 NOTE — Progress Notes (Signed)
Is there any way to get her on schedule today for holter set up?  I am okay seeing her tomorrow as long as she is aware of EMERGENCY ROOM precations. Was our other patient already given the appointment time?   How was a same day used ahead?  KK

## 2019-04-19 NOTE — Telephone Encounter (Signed)
Is there any way to get her on schedule today for holter set up?  I am okay seeing her tomorrow as long as she is aware of EMERGENCY ROOM precations. Was our other patient already given the appointment time?   How was a same day used ahead?  KK

## 2019-04-19 NOTE — Telephone Encounter (Signed)
Dr. Gwenlyn Perking please advise. That spot was intended for another patient who was just discharged from the hospital. This patient was advised to go to ER but has declined. Should she keep the appointment tomorrow and we can work the other patient in elsewhere?

## 2019-04-20 ENCOUNTER — Ambulatory Visit: Payer: BLUE CROSS/BLUE SHIELD | Attending: Internal Medicine | Admitting: Internal Medicine

## 2019-04-20 ENCOUNTER — Ambulatory Visit (INDEPENDENT_AMBULATORY_CARE_PROVIDER_SITE_OTHER): Payer: BLUE CROSS/BLUE SHIELD

## 2019-04-20 DIAGNOSIS — R7301 Impaired fasting glucose: Secondary | ICD-10-CM

## 2019-04-20 DIAGNOSIS — R002 Palpitations: Secondary | ICD-10-CM

## 2019-04-20 DIAGNOSIS — Z0000 Encounter for general adult medical examination without abnormal findings: Secondary | ICD-10-CM

## 2019-04-20 LAB — VITAMIN D, 25 HYDROXY: Vitamin D, 25 Hydroxy: 30.2 ng/mL (ref 20.0–50.0)

## 2019-04-20 LAB — COMPREHENSIVE METABOLIC PANEL
Alanine Transferase (ALT): 20 U/L (ref 5–54)
Albumin: 4.4 g/dL (ref 3.2–4.6)
Alkaline Phosphatase (ALP): 55 U/L (ref 35–115)
Aspartate Transaminase (AST): 19 U/L (ref 15–43)
Bilirubin Total: 0.6 mg/dL (ref 0.3–1.3)
Calcium: 9.1 mg/dL (ref 8.6–10.5)
Carbon Dioxide Total: 25 mmol/L (ref 24–32)
Chloride: 101 mmol/L (ref 95–110)
Creatinine Serum: 0.82 mg/dL (ref 0.44–1.27)
E-GFR Creatinine (Female): 77 mL/min/{1.73_m2}
E-GFR, African American (Female): 89 mL/min/{1.73_m2}
Glucose: 100 mg/dL — ABNORMAL HIGH (ref 70–99)
Potassium: 4.4 mmol/L (ref 3.3–5.0)
Protein: 7.6 g/dL (ref 6.3–8.3)
Sodium: 138 mmol/L (ref 135–145)
Urea Nitrogen, Blood (BUN): 15 mg/dL (ref 8–22)

## 2019-04-20 LAB — CBC WITH DIFFERENTIAL
Basophils % Auto: 0.6 %
Basophils Abs Auto: 0 10*3/uL (ref 0.0–0.2)
Eosinophils % Auto: 1.7 %
Eosinophils Abs Auto: 0.1 10*3/uL (ref 0.0–0.5)
Hematocrit: 37.4 % (ref 36.0–46.0)
Hemoglobin: 12.8 g/dL (ref 12.0–16.0)
Lymphocytes % Auto: 30.8 %
Lymphocytes Abs Auto: 2.1 10*3/uL (ref 1.0–4.8)
MCH: 30 pg (ref 27.0–33.0)
MCHC: 34.2 % (ref 32.0–36.0)
MCV: 87.6 fL (ref 80.0–100.0)
MPV: 9.2 fL (ref 6.8–10.0)
Monocytes % Auto: 6.1 %
Monocytes Abs Auto: 0.4 10*3/uL (ref 0.1–0.8)
Neutrophils % Auto: 60.8 %
Neutrophils Abs Auto: 4.2 10*3/uL (ref 1.8–7.7)
Platelet Count: 216 10*3/uL (ref 130–400)
RDW: 14 % (ref 0.0–14.7)
Red Blood Cell Count: 4.27 10*6/uL (ref 4.00–5.20)
White Blood Cell Count: 6.9 10*3/uL (ref 4.5–11.0)

## 2019-04-20 LAB — MAGNESIUM (MG): Magnesium (Mg): 2.2 mg/dL (ref 1.5–2.6)

## 2019-04-20 LAB — LIPID PANEL WITH DLDL REFLEX
Cholesterol: 217 mg/dL — ABNORMAL HIGH (ref 0–200)
HDL Cholesterol: 79 mg/dL (ref >=35)
LDL Cholesterol Calculation: 127 mg/dL (ref <130)
Non-HDL Cholesterol: 138 mg/dL (ref <=150)
Total Cholesterol: HDL Ratio: 2.7 (ref <4.0)
Triglyceride: 53 mg/dL (ref 35–160)

## 2019-04-20 LAB — TSH WITH FREE T4 REFLEX: Thyroid Stimulating Hormone: 1.09 u[IU]/mL (ref 0.35–3.30)

## 2019-04-20 LAB — C-REACTIVE PROTEIN: C-Reactive Protein: 0.4 mg/dL (ref 0.1–0.8)

## 2019-04-20 LAB — THYROXINE, FREE (FREE T4): Thyroxine, Free (Free T4): 0.85 ng/dL (ref 0.56–1.64)

## 2019-04-20 MED ORDER — METOPROLOL TARTRATE 25 MG TABLET
12.5000 mg | ORAL_TABLET | Freq: Every day | ORAL | 1 refills | Status: DC | PRN
Start: 2019-04-20 — End: 2019-05-14

## 2019-04-20 NOTE — Progress Notes (Signed)
Chief Complaint   Patient presents with    Palpitations   Leah Campbell is a 64yr old female presents for palpitations. Her husband was in the hospital for a week and she noted fish flipping around in her chest. She has been cutting down on ca  She got a new bed and it is great and she is sleeping well.   She has been taking magnesium for the last week.   She has been taking  Misty Stanley Richter-therapist whom she sees weekly intermittently x years.   Current Outpatient Medications on File Prior to Visit   Medication Sig Dispense Refill    drospirenone-estradiol (ANGELIQ) 0.25-0.5 mg Tablet Takes just twice weekly      Fluoxetine (PROZAC) 20 mg tablet Take 0.5 tablets by mouth every morning. 30 tablet 1    xylitoL (XYLIMELTS) 550 mg Muco-Adhesive Buccal Tablet 550 mg by Mucous Membrane route three times daily if needed. 100 tablet 1     No current facility-administered medications on file prior to visit.       Vitals:    04/20/19 0939   BP: (P) 144/75   Orthostatic Position: (P) sitting   Pulse: (P) 74   Temp: (P) 36.8 C (98.2 F)   SpO2: (P) 100%   Weight: (P) 72.6 kg (160 lb)   Height: (P) 1.638 m (5' 4.5")   fair appearing in nad  No cervical lad  RRR no M  Clear lungs  ecg showing up to 3 PVCs in a strip otherwise normal.   (R73.01) IFG (impaired fasting glucose)  (primary encounter diagnosis)  Plan: Hemoglobin A1C            (Z00.00) Annual physical exam  Comment: labs ordered for physical today and to explore if underlying reason for palpitations.   Plan: Comprehensive Metabolic Panel, Lipid Panel with        DLDL Reflex, CBC with Differential, Magnesium         (Mg), C-Reactive Protein, Thyroid Stimulating         Hormone, Thyroxine, Free (Free T4), Vitamin D,         25 Hydroxy          Patient and provider both wore surgical masks during the visit today. Provider wore a face shield as well.   Patient Instructions   Metoprolol as needed for anxiety/premature beats  Labs today  Lorazepam as needed and can be  combined with metoprolol  Limit caffeine increase      Marcello Moores MD  [facility] Internal Medicine-Community Physician

## 2019-04-20 NOTE — Patient Instructions (Signed)
Metoprolol as needed for anxiety/premature beats  Labs today  Lorazepam as needed and can be combined with metoprolol  Limit caffeine increase

## 2019-04-20 NOTE — Telephone Encounter (Signed)
The same days are finally thawing as they should. Patient worked into clinic today no follow up at this time is needed.

## 2019-04-20 NOTE — Progress Notes (Signed)
Patient WAS wearing a surgical mask  Contact and Droplet precautions were followed when caring for the patient.   PPE used by provider during encounter: Surgical mask and Face Shield/Goggles   Patient identification verified by last name and date of birth.Vital signs taken and recorded. Patient has been screened for pain. Pharmacy preference and allergies have been reviewed with patient. Patient asked about OTC medications and supplements and if they have been hospitalized since last visit.   Daden Mahany, MA I

## 2019-04-21 ENCOUNTER — Encounter: Payer: Self-pay | Admitting: Internal Medicine

## 2019-04-21 LAB — HEMOGLOBIN A1C
HGB A1C: 5.6 % (ref 3.9–5.6)
Hgb A1C,Glucose Est Avg: 114 mg/dL

## 2019-04-23 ENCOUNTER — Encounter: Payer: Self-pay | Admitting: Internal Medicine

## 2019-04-25 LAB — POC ELECTROCARDIOGRAM WITH RHYTHM STRIP: QTC: 448

## 2019-05-13 ENCOUNTER — Other Ambulatory Visit: Payer: Self-pay | Admitting: Internal Medicine

## 2019-05-16 MED ORDER — METOPROLOL TARTRATE 25 MG TABLET
12.5000 mg | ORAL_TABLET | Freq: Every day | ORAL | 1 refills | Status: DC | PRN
Start: 2019-05-16 — End: 2020-08-07

## 2019-05-16 NOTE — Telephone Encounter (Signed)
In response to faxed request from [facility] pharmacy req 90 day supply

## 2019-05-16 NOTE — Addendum Note (Signed)
Addended by: Loreta Ave on: 05/16/2019 02:33 PM     Modules accepted: Orders

## 2019-05-16 NOTE — Addendum Note (Signed)
Addended by: Edsel Petrin on: 05/16/2019 06:10 PM     Modules accepted: Orders

## 2019-08-07 ENCOUNTER — Ambulatory Visit: Payer: BLUE CROSS/BLUE SHIELD

## 2019-09-26 ENCOUNTER — Other Ambulatory Visit: Payer: Self-pay | Admitting: Internal Medicine

## 2019-09-26 NOTE — Telephone Encounter (Signed)
Last Visit: 04/20/2019  Last Ordered: 03/31/2019    Leah Westfall, MA

## 2019-10-03 ENCOUNTER — Other Ambulatory Visit: Payer: Self-pay | Admitting: Family Medicine

## 2019-10-03 DIAGNOSIS — Z1239 Encounter for other screening for malignant neoplasm of breast: Secondary | ICD-10-CM

## 2019-10-04 ENCOUNTER — Ambulatory Visit: Payer: BLUE CROSS/BLUE SHIELD

## 2019-10-16 ENCOUNTER — Ambulatory Visit (LOCAL_COMMUNITY_HEALTH_CENTER): Payer: Self-pay

## 2019-10-16 ENCOUNTER — Other Ambulatory Visit: Payer: Self-pay

## 2019-10-16 DIAGNOSIS — Z111 Encounter for screening for respiratory tuberculosis: Secondary | ICD-10-CM

## 2019-10-19 ENCOUNTER — Ambulatory Visit (LOCAL_COMMUNITY_HEALTH_CENTER): Payer: Self-pay

## 2019-10-19 ENCOUNTER — Other Ambulatory Visit: Payer: Self-pay

## 2019-10-19 DIAGNOSIS — Z111 Encounter for screening for respiratory tuberculosis: Secondary | ICD-10-CM

## 2019-10-19 LAB — TB SKIN TEST
Induration: 0 mm
TB Skin Test: NEGATIVE

## 2019-11-12 ENCOUNTER — Telehealth: Payer: Self-pay | Admitting: Internal Medicine

## 2019-11-12 NOTE — Telephone Encounter (Signed)
RILDA BULLS is a 64yr old female  3 patient identifiers used.  Per:   patient.    Disposition: CONSULT MD: Please advise.      Pt is requesting advise on getting the booster for COVID-19 vaccine, she states she had a swollen lymph node reaction with the 1st dose but not the second. Pt states that her husband is going through a new therapy as he is dx with cancer. She states that she is due to get the vaccine at noon today.       Chucky May RN  PCN Triage

## 2019-11-12 NOTE — Telephone Encounter (Signed)
Reviewed message from MD with patient. Questions were answered and patient has no further questions or concerns at this time. Advised to please callback to the advice line at any time if questions arise.    Marciano Mundt Heppe-Aldrich RN  PCN Triage

## 2019-11-12 NOTE — Telephone Encounter (Signed)
Advised pt that Dr. Ozella Almond MA was notified of the message about the vaccine. Told the pt that the MA would reach out if the it wasn't advised to get the booster.     Chucky May RN  PCN Triage

## 2019-11-12 NOTE — Telephone Encounter (Signed)
My notes say that she had swollen lymph nodes after each shot. This is not a contraindication to a booster. My notes also say that she may have had dry mouth. Usually the booster shot produces a similar effect to the second vaccine dose. I am fine with her getting a booster. KK

## 2019-11-20 ENCOUNTER — Ambulatory Visit: Payer: BLUE CROSS/BLUE SHIELD

## 2019-12-18 ENCOUNTER — Ambulatory Visit: Payer: BLUE CROSS/BLUE SHIELD

## 2020-03-19 ENCOUNTER — Other Ambulatory Visit: Payer: Self-pay | Admitting: Internal Medicine

## 2020-03-19 NOTE — Telephone Encounter (Signed)
Last Visit: 04/20/2019  Last Ordered: 03/31/2019    Loreta Ave, MA

## 2020-03-20 NOTE — Telephone Encounter (Signed)
Please schedule future physical can be 3-6 mos out unless urgent concerns. Marcello Moores MD

## 2020-03-21 NOTE — Telephone Encounter (Signed)
General Advice / Message to MD:    Left Voicemail to call back for message below.     Thank you,     Garner Gavel  [facility]  Patient Contact Center

## 2020-05-06 ENCOUNTER — Telehealth: Payer: Self-pay | Admitting: Internal Medicine

## 2020-05-06 DIAGNOSIS — R300 Dysuria: Secondary | ICD-10-CM

## 2020-05-06 NOTE — Telephone Encounter (Signed)
I have ordered a urinalysis. As it has been almost a year since I have seen Leah Campbell I prefer that she be moved to my schedule. Ok to [employer] her at 10:40am 4/20 with me and cancel with Dr. Truddie Coco. Marcello Moores MD

## 2020-05-06 NOTE — Telephone Encounter (Signed)
Talked to Allyson Sabal, ID verified x 3    MD message conveyed to patient and was able to verbalize understanding.    Will go to lab and informed will go to clinic to see PCP tomorrow at 10:40 am.    Clinic called and overbooked patient with PCP 10:40am 05/07/20    Payton Emerald, RN  PCN Triage

## 2020-05-06 NOTE — Telephone Encounter (Signed)
Leah Campbell is a 65yr old female  3 patient identifiers used.  Per:   patient.    Disposition: Appointment given per  protocol  Per:   patient verbalizes agreement to plan. Agrees to callback with any increase in symptoms/concerns or questions.    See Assessment Below  ClearTriage Note:     Reports  Thinks have a UTI  Symptoms started 3 weeks ago  Urine have odor, super tired, no good urine flow.  No pain on urination and no back pain  When she goes does not feel like she is completely finished  Requesting for PCP to put in urine test.   With VV for tomorrow    Protocol Used: Urinary Symptoms (Adult)  Protocol-Based Disposition: See PCP or Video Visit within 24 Hours    Video visit offer not recorded    Positive Triage Question:  * Bad or foul-smelling urine    Care Advice Discussed:  * Reasons To Call Back      - Fever occurs      - Unable to urinate and bladder feels full      - You become worse.    Negative Triage Questions:  * Shock suspected (e.g., cold/pale/clammy skin, too weak to stand, low BP, rapid pulse)  * Sounds like a life-threatening emergency to the triager  * [1] Unable to urinate (or only a few drops) > 4 hours AND [2] bladder feels very full (e.g., palpable bladder or strong urge to urinate)  * [1] Decreased urination and [2] drinking very little AND [2] dehydration suspected (e.g., dark urine, no urine > 12 hours, very dry mouth, very lightheaded)  * Patient sounds very sick or weak to the triager  * Fever > 100.4 F (38.0 C)  * Side (flank) or lower back pain present  * [1] Can't control passage of urine (i.e., urinary incontinence) AND [2] new-onset (< 2 weeks) or worsening  * Urinating more frequently than usual (i.e., frequency)      Payton Emerald, RN  PCN Triage

## 2020-05-07 ENCOUNTER — Ambulatory Visit: Payer: BLUE CROSS/BLUE SHIELD | Admitting: Internal Medicine

## 2020-05-07 ENCOUNTER — Ambulatory Visit: Payer: BLUE CROSS/BLUE SHIELD | Attending: Internal Medicine | Admitting: Internal Medicine

## 2020-05-07 ENCOUNTER — Ambulatory Visit (INDEPENDENT_AMBULATORY_CARE_PROVIDER_SITE_OTHER): Payer: BLUE CROSS/BLUE SHIELD

## 2020-05-07 ENCOUNTER — Encounter: Payer: Self-pay | Admitting: Internal Medicine

## 2020-05-07 VITALS — BP 140/80

## 2020-05-07 DIAGNOSIS — R5383 Other fatigue: Secondary | ICD-10-CM

## 2020-05-07 DIAGNOSIS — Z1159 Encounter for screening for other viral diseases: Secondary | ICD-10-CM | POA: Insufficient documentation

## 2020-05-07 DIAGNOSIS — Z0000 Encounter for general adult medical examination without abnormal findings: Secondary | ICD-10-CM | POA: Insufficient documentation

## 2020-05-07 DIAGNOSIS — E559 Vitamin D deficiency, unspecified: Secondary | ICD-10-CM

## 2020-05-07 DIAGNOSIS — R339 Retention of urine, unspecified: Secondary | ICD-10-CM | POA: Insufficient documentation

## 2020-05-07 LAB — CBC WITH DIFFERENTIAL
Basophils % Auto: 1 %
Basophils Abs Auto: 0.1 10*3/uL (ref 0.0–0.2)
Eosinophils % Auto: 1.2 %
Eosinophils Abs Auto: 0.1 10*3/uL (ref 0.0–0.5)
Hematocrit: 36.5 % (ref 36.0–46.0)
Hemoglobin: 12.1 g/dL (ref 12.0–16.0)
Lymphocytes % Auto: 19.5 %
Lymphocytes Abs Auto: 1.4 10*3/uL (ref 1.0–4.8)
MCH: 29.1 pg (ref 27.0–33.0)
MCHC: 33.3 % (ref 32.0–36.0)
MCV: 87.5 fL (ref 80.0–100.0)
MPV: 8.8 fL (ref 6.8–10.0)
Monocytes % Auto: 6.4 %
Monocytes Abs Auto: 0.4 10*3/uL (ref 0.1–0.8)
Neutrophils % Auto: 71.9 %
Neutrophils Abs Auto: 5 10*3/uL (ref 1.8–7.7)
Platelet Count: 233 10*3/uL (ref 130–400)
RDW: 13.6 % (ref 0.0–14.7)
Red Blood Cell Count: 4.17 10*6/uL (ref 4.00–5.20)
White Blood Cell Count: 7 10*3/uL (ref 4.5–11.0)

## 2020-05-07 LAB — URINALYSIS-COMPLETE
Bilirubin Urine: NEGATIVE
Glucose Urine: NEGATIVE mg/dL
Ketones: NEGATIVE mg/dL
Leuk Esterase: NEGATIVE
Nitrite Urine: NEGATIVE
Protein Urine: NEGATIVE mg/dL
RBC: 1 /HPF (ref 0–2)
Specific Gravity, Urine: 1.005 (ref 1.002–1.030)
Squamous EPI: 1 /HPF (ref <=10)
Urobilinogen: NEGATIVE mg/dL (ref ?–2.0)
pH URINE: 6 (ref 4.8–7.8)

## 2020-05-07 LAB — LIPID PANEL WITH DLDL REFLEX
Cholesterol: 199 mg/dL (ref <200)
HDL Cholesterol: 65 mg/dL (ref >40)
LDL Cholesterol Calculation: 118 mg/dL — ABNORMAL HIGH (ref <100)
Non-HDL Cholesterol: 134 mg/dL (ref <=150)
Total Cholesterol: HDL Ratio: 3.1 (ref <4.0)
Triglyceride: 82 mg/dL (ref <150)

## 2020-05-07 LAB — COMPREHENSIVE METABOLIC PANEL
Alanine Transferase (ALT): 14 U/L (ref <=33)
Albumin: 4.5 g/dL (ref 4.0–4.9)
Alkaline Phosphatase (ALP): 76 U/L (ref 35–129)
Aspartate Transaminase (AST): 16 U/L (ref <=41)
Bilirubin Total: 0.4 mg/dL (ref <=1.2)
Calcium: 9 mg/dL (ref 8.6–10.0)
Carbon Dioxide Total: 24 mmol/L (ref 22–29)
Chloride: 103 mmol/L (ref 98–107)
Creatinine Serum: 0.83 mg/dL (ref 0.51–1.17)
E-GFR Creatinine (Female): 76 mL/min/{1.73_m2}
Glucose: 95 mg/dL (ref 74–109)
Potassium: 4 mmol/L (ref 3.4–5.1)
Protein: 7.2 g/dL (ref 6.6–8.7)
Sodium: 140 mmol/L (ref 136–145)
Urea Nitrogen, Blood (BUN): 12 mg/dL (ref 6–20)

## 2020-05-07 LAB — C-REACTIVE PROTEIN: C-REACTIVE PROTEIN: 0.9 mg/dL — ABNORMAL HIGH (ref <0.5)

## 2020-05-07 LAB — POC URINALYSIS-DIPSTICK
POC BILIRUBIN URINE: NEGATIVE
POC GLUCOSE URINE: NEGATIVE mg/dL
POC KETONES: NEGATIVE
POC LEUK. ESTERASE: NEGATIVE
POC NITRITE URINE: NEGATIVE
POC PH URINALYSIS: 6 pH (ref 4.8–7.8)
POC PROTEIN URINE: NEGATIVE
POC SP GRAVITY: 1.005 (ref 1.002–1.030)
POC UROBILINOGEN: 0.2 EU/dL

## 2020-05-07 LAB — THYROXINE, FREE (FREE T4): Thyroxine, Free (Free T4): 1.09 ng/dL (ref 0.93–1.70)

## 2020-05-07 LAB — VITAMIN D, 25 HYDROXY: Vitamin D, 25 Hydroxy: 31.2 ng/mL (ref 8.0–60.0)

## 2020-05-07 LAB — THYROID STIMULATING HORMONE: Thyroid Stimulating Hormone: 0.98 u[IU]/mL (ref 0.27–4.20)

## 2020-05-07 LAB — CREATININE SPOT URINE: Creatinine Spot Urine: 28.6 mg/dL

## 2020-05-07 LAB — HEMOGLOBIN A1C
Hgb A1C,Glucose Est Avg: 120 mg/dL
Hgb A1C: 5.8 % — ABNORMAL HIGH (ref 3.9–5.6)

## 2020-05-07 LAB — VITAMIN B12: Vitamin B12: 410 pg/mL (ref 213–816)

## 2020-05-07 LAB — HEPATITIS C AB SCREEN: Hepatitis C Ab Screen: NONREACTIVE

## 2020-05-07 LAB — HSCRP: HsCRP: 8.2 mg/L

## 2020-05-07 NOTE — Patient Instructions (Signed)
[phone] to schedule mammogram  Labs today nonfasting  Future physical can check for cystocele

## 2020-05-07 NOTE — Nursing Note (Signed)
Patient WAS wearing a surgical mask  Contact precautions were followed when caring for the patient.   PPE used by provider during encounter: Surgical mask

## 2020-05-07 NOTE — Progress Notes (Signed)
Chief Complaint   Patient presents with    Urinary Tract Infection     Symptoms        Leah Campbell is a 65yr old female who presents for 3 weeks of severe fatigue and incomplete bladder emptying and bladder odor. Not having urgency. Drank more water yesterday but not today.  No belly pain. Her father passed away 04/17/2022 and her husband has been ill (lymphoma) but is doing better. Son is depressed and unemployed.   3rd covid shot no negative effect 2nd booster yesterday  Snores at night.  She has her watch and it notes snoring. No low oxygen alarms.  Current Outpatient Medications on File Prior to Visit   Medication Sig Dispense Refill    drospirenone-estradiol (ANGELIQ) 0.25-0.5 mg Tablet Takes just twice weekly      Fluoxetine (PROZAC) 20 mg tablet Take 0.5 tablets by mouth every morning. 30 tablet 1    Metoprolol Tartrate (LOPRESSOR) 25 mg Tablet Take 0.5 tablets by mouth once daily if needed. 90 tablet 1     No current facility-administered medications on file prior to visit.     Vitals:    05/07/20 1104 05/07/20 1126   BP: (P) 144/86 140/80   SITE: (P) right arm right arm   Orthostatic Position: (P) sitting sitting   Cuff Size: (P) regular large   Pulse: (P) 85    Temp: (P) 36.5 C (97.7 F)    TempSrc: (P) Temporal    SpO2: (P) 100%    Weight: (P) 74 kg (163 lb 1.6 oz)      Physical Exam   Constitutional:  well-developed, well-nourished, and in no distress. No distress.   Oropharynx: masked  Neck: no cervical lymphadenopathy  Cardiovascular: Normal rate, regular rhythm and normal heart sounds.   No murmur heard.  Pulmonary/Chest: Effort normal and breath sounds normal.No wheezes. No rales.   Musculoskeletal:  no lower extremity edema.   Neurological:  alert.   (E55.9) Hypovitaminosis D  (primary encounter diagnosis)  Plan: Hemoglobin A1C, Comprehensive Metabolic Panel,         Microalbumin, Creatinine Spot Urine, Lipid         Panel with DLDL Reflex, CBC with Differential,         C-Reactive Protein,  hsCRP, Vitamin B12, Thyroid        Stimulating Hormone, Thyroxine, Free (Free T4),        Vitamin D, 25 Hydroxy, Creatinine Spot Urine            (R53.83) Fatigue, unspecified type  Labs ordered.  Plan: Hemoglobin A1C, Comprehensive Metabolic Panel,         Microalbumin, Creatinine Spot Urine, Lipid         Panel with DLDL Reflex, CBC with Differential,         C-Reactive Protein, hsCRP, Vitamin B12, Thyroid        Stimulating Hormone, Thyroxine, Free (Free T4),        Vitamin D, 25 Hydroxy, Creatinine Spot Urine            (Z11.59) Need for hepatitis C screening test  Plan: Hepatitis C Ab Screen            (R33.9) Incomplete emptying of bladder  Check for future cystocele  Plan: Urinalysis-Complete, Culture Urine, [place],         Urinalysis-Complete, Culture Urine, Bacti                  Patient and provider  both wore surgical masks during the visit today and provider also wore a face shield   Patient Instructions   [phone] to schedule mammogram  Labs today nonfasting  Future physical can check for cystocele      Marcello Moores MD  [facility] Internal Medicine-Community Physician

## 2020-05-07 NOTE — Nursing Note (Signed)
Patient vitals checked, allergies verified, pharmacy verified and patient screened for pain.  By Javarie Crisp, MA

## 2020-05-08 LAB — CULTURE URINE, BACTI: URINE CULTURE: NO GROWTH

## 2020-05-13 ENCOUNTER — Telehealth: Payer: Self-pay | Admitting: Internal Medicine

## 2020-05-13 NOTE — Telephone Encounter (Signed)
Reviewed Mammogram Look back report.    Order for Mammogram is current in patient's chart.    I have attempted to contact this patient by phone with the following results: left message to return my call on answering machine.     [facility]:   If patient returns phone call, please give them information below to schedule their mammogram.    [place]:  [address]  [phone]    [facility]: [facility]  [address]  [phone]    [facility]: [place]  [address]  [phone]    Please document and close encounter.

## 2020-06-17 ENCOUNTER — Other Ambulatory Visit: Payer: Self-pay | Admitting: Internal Medicine

## 2020-06-17 NOTE — Telephone Encounter (Signed)
Last Visit: 05/07/2020  Last Ordered: 03/20/2020 with 0 refills    Royden Purl, MA II

## 2020-06-18 DIAGNOSIS — K8020 Calculus of gallbladder without cholecystitis without obstruction: Secondary | ICD-10-CM

## 2020-06-18 HISTORY — DX: Calculus of gallbladder without cholecystitis without obstruction: K80.20

## 2020-06-30 ENCOUNTER — Telehealth: Payer: Self-pay | Admitting: Internal Medicine

## 2020-06-30 DIAGNOSIS — R109 Unspecified abdominal pain: Secondary | ICD-10-CM

## 2020-06-30 NOTE — Telephone Encounter (Signed)
I have ordered an urgent ultrasound which does not require authorization. She can call first thing tomorrow [phone] and or [phone] to get it scheduled. She can drink fluids but should not eat until scheduled in case it can be done same day tomorrow. She will also need to have lab work done to be sure she is safe to avoid EMERGENCY ROOM/ urgent care. She needs to be able to keep down fluids and if pain becomes severe or she gets a fever she needs to go to EMERGENCY ROOM. She will need to take a home COVID test before coming into the office and should not come in if positive. COVID can present in many ways including with belly pain. There are GI bugs also going around. Marcello Moores MD

## 2020-06-30 NOTE — Telephone Encounter (Signed)
NYKOLE MATOS is a 65yr old female  3 patient identifiers used.  Per:   patient.    Disposition: Consult with MD  She does want to go to ER.   She wants to see you in the am for possible ultra sound?  Gall Bladder issues in her family.  Please advise.   Per:   patient verbalizes agreement to plan. Agrees to callback with any increase in symptoms/concerns or questions.    See Assessment Below  ClearTriage Note:  Having RUQ abdominal pain.   Started Tuesday night and it would come and go.   Somewhat relieved by advil.    Was ok this am with no pain and now it is a 6/10.  No fever.   Slight nausea.   Burping a lot but relieved the pain. Only a little.    Feels like a knot in her upper right side.   Radiates a little to the back when the pain is bad, but not now.  Tender to palpate.  Tried TUMS about 30 minutes ago with no relief.    Protocol Used: Abdominal Pain - Upper (Adult)  Protocol-Based Disposition: Go to ED Now  Override (Final) Disposition: Consult MD  Override Reason: Caller refused suggested disposition    Positive Triage Question:  * [1] Pain lasts > 10 minutes AND [2] age > 51    Negative Triage Questions:  * SEVERE difficulty breathing (e.g., struggling for each breath, speaks in single words)  * Shock suspected (e.g., cold/pale/clammy skin, too weak to stand, low BP, rapid pulse)  * Difficult to awaken or acting confused (e.g., disoriented, slurred speech)  * Passed out (i.e., lost consciousness, collapsed and was not responding)  * Visible sweat on face or sweat dripping down face  * Sounds like a life-threatening emergency to the triager  * [1] SEVERE pain (e.g., excruciating) AND [2] present > 1 hour  * [1] Pain lasts > 10 minutes AND [2] age > 53 AND [3] associated chest, arm, neck, upper back or jaw pain  * [1] Pain lasts > 10 minutes AND [2] age > 88 AND [3] at least one cardiac risk factor (i.e., hypertension, diabetes, obesity, smoker or strong family history of heart disease)  * [1] Pain lasts >  10 minutes AND [2] history of heart disease (i.e., heart attack, bypass surgery, angina, angioplasty, CHF; not just a heart murmur)  * [1] Pain lasts > 10 minutes AND [2] difficulty breathing  * [1] Vomiting AND [2] contains red blood  (Exception: few streaks and only occurred once)  * [1] Vomiting AND [2] contains black ("coffee ground") material  * Blood in bowel movements  (Exception: blood on surface of BM with constipation)  * Black or tarry bowel movements (Exception: chronic-unchanged black-grey bowel movements AND is taking iron pills or Pepto-bismol)    Kathreen Devoid, RN  PCN Triage

## 2020-06-30 NOTE — Telephone Encounter (Signed)
3 patient identifiers used  Reviewed message from MD with patient. Questions were answered and patient has no further questions or concerns at this time. Advised to please callback to the advice line at any time if questions arise    Holding down fluids fine.

## 2020-07-01 ENCOUNTER — Encounter: Payer: Self-pay | Admitting: Internal Medicine

## 2020-07-01 ENCOUNTER — Other Ambulatory Visit: Payer: Self-pay | Admitting: Internal Medicine

## 2020-07-01 ENCOUNTER — Ambulatory Visit (INDEPENDENT_AMBULATORY_CARE_PROVIDER_SITE_OTHER): Payer: BLUE CROSS/BLUE SHIELD

## 2020-07-01 ENCOUNTER — Ambulatory Visit
Admission: RE | Admit: 2020-07-01 | Discharge: 2020-07-01 | Disposition: A | Discharge status: Home / Self Care | Payer: BLUE CROSS/BLUE SHIELD | Source: Ambulatory Visit | Attending: Internal Medicine | Admitting: Internal Medicine

## 2020-07-01 DIAGNOSIS — R5383 Other fatigue: Secondary | ICD-10-CM | POA: Insufficient documentation

## 2020-07-01 DIAGNOSIS — E559 Vitamin D deficiency, unspecified: Secondary | ICD-10-CM | POA: Insufficient documentation

## 2020-07-01 DIAGNOSIS — K8050 Calculus of bile duct without cholangitis or cholecystitis without obstruction: Secondary | ICD-10-CM

## 2020-07-01 DIAGNOSIS — Z0000 Encounter for general adult medical examination without abnormal findings: Secondary | ICD-10-CM | POA: Insufficient documentation

## 2020-07-01 DIAGNOSIS — R109 Unspecified abdominal pain: Secondary | ICD-10-CM | POA: Insufficient documentation

## 2020-07-01 LAB — CBC WITH DIFFERENTIAL
Basophils % Auto: 1 %
Basophils Abs Auto: 0.1 10*3/uL (ref 0.0–0.2)
Eosinophils % Auto: 3.2 %
Eosinophils Abs Auto: 0.3 10*3/uL (ref 0.0–0.5)
Hematocrit: 38.6 % (ref 36.0–46.0)
Hemoglobin: 12.8 g/dL (ref 12.0–16.0)
Lymphocytes % Auto: 22 %
Lymphocytes Abs Auto: 1.8 10*3/uL (ref 1.0–4.8)
MCH: 29.2 pg (ref 27.0–33.0)
MCHC: 33.2 % (ref 32.0–36.0)
MCV: 88.1 fL (ref 80.0–100.0)
MPV: 8.4 fL (ref 6.8–10.0)
Monocytes % Auto: 6.2 %
Monocytes Abs Auto: 0.5 10*3/uL (ref 0.1–0.8)
Neutrophils % Auto: 67.6 %
Neutrophils Abs Auto: 5.6 10*3/uL (ref 1.8–7.7)
Platelet Count: 288 10*3/uL (ref 130–400)
RDW: 13.7 % (ref 0.0–14.7)
Red Blood Cell Count: 4.38 10*6/uL (ref 4.00–5.20)
White Blood Cell Count: 8.3 10*3/uL (ref 4.5–11.0)

## 2020-07-01 LAB — COMPREHENSIVE METABOLIC PANEL
Alanine Transferase (ALT): 13 U/L (ref <=33)
Albumin: 4.7 g/dL (ref 4.0–4.9)
Alkaline Phosphatase (ALP): 83 U/L (ref 35–129)
Aspartate Transaminase (AST): 16 U/L (ref <=41)
Bilirubin Total: 0.5 mg/dL (ref <=1.2)
Calcium: 9.8 mg/dL (ref 8.6–10.0)
Carbon Dioxide Total: 25 mmol/L (ref 22–29)
Chloride: 100 mmol/L (ref 98–107)
Creatinine Serum: 0.96 mg/dL (ref 0.51–1.17)
E-GFR Creatinine (Female): 64 mL/min/{1.73_m2}
Glucose: 88 mg/dL (ref 74–109)
Potassium: 4.2 mmol/L (ref 3.4–5.1)
Protein: 7.9 g/dL (ref 6.6–8.7)
Sodium: 139 mmol/L (ref 136–145)
Urea Nitrogen, Blood (BUN): 8 mg/dL (ref 6–20)

## 2020-07-01 LAB — LIPASE: Lipase: 22 U/L (ref 13–60)

## 2020-07-01 LAB — C-REACTIVE PROTEIN: C-REACTIVE PROTEIN: 4.1 mg/dL — ABNORMAL HIGH (ref <0.5)

## 2020-07-01 LAB — MICROALBUMIN: Microalbumin Urine: 1.9 mg/dL

## 2020-08-07 ENCOUNTER — Encounter: Payer: Self-pay | Admitting: Surgery

## 2020-08-07 ENCOUNTER — Ambulatory Visit: Payer: BLUE CROSS/BLUE SHIELD | Admitting: Surgery

## 2020-08-07 VITALS — BP 130/78 | HR 79 | Temp 96.9°F | Resp 14 | Ht 63.0 in | Wt 153.0 lb

## 2020-08-07 DIAGNOSIS — K8020 Calculus of gallbladder without cholecystitis without obstruction: Secondary | ICD-10-CM

## 2020-08-07 NOTE — Patient Instructions (Signed)
Information on Gallstones  What are gallstones?  Gallstones are hardened deposits of digestive fluid that can form in your gallbladder. Your gallbladder is a small, pear-shaped organ on the right side of your abdomen, just beneath your liver. The gallbladder holds a digestive fluid called bile that's released into your small intestine.   Gallstones range in size from as small as a grain of sand to as large as a golf ball. Some people develop just one gallstone, while others develop many.     Signs and Symptoms:  Gallstones may cause no signs or symptoms. If a gallstone lodges in a duct and causes a blockage, signs and symptoms may result, such as:   Sudden and rapidly intensifying pain in the upper right portion of your abdomen   Sudden and rapidly intensifying pain in the center of your abdomen, just below your breastbone   Back pain between your shoulder blades   Pain in your right shoulder   Gallstone pain may last several minutes to a few hours.     Why do Gallstones occur?  Your bile contains too much cholesterol. Normally, your bile contains enough chemicals to dissolve the cholesterol excreted by your liver. But if your bile contains more cholesterol than can be dissolved, the cholesterol may form into crystals and eventually into stones. Cholesterol in your bile has no relation to the levels of cholesterol in your blood.   Your bile contains too much bilirubin. Bilirubin is a chemical that's produced when your body breaks down red blood cells. Certain conditions cause your liver to make too much bilirubin, including liver cirrhosis, biliary tract infections and certain blood disorders.   Your gallbladder doesn't empty correctly. If your gallbladder doesn't empty completely or often enough, bile may become very concentrated and this contributes to the formation of gallstones.     Types of gallstones   Cholesterol gallstones. The most common type of gallstones, called cholesterol gallstones, often appears  yellow in color. These gallstones are composed mainly of undissolved cholesterol, although they can also have other components.   Pigment gallstones. These dark brown or black stones form when your bile contains too much bilirubin.     When to call the Doctor or Nurse Practitioner:  Make an appointment with your doctor/nurse practitioner if you have any signs or symptoms that worry you.   Seek immediate care if you develop signs and symptoms of a serious gallstone complication, such as:   Right-sided Abdominal pain so intense that you can't sit still or find a comfortable position   Yellowing of your skin and the whites of your eyes   High fever with chills    Gallbladder Nutrition Therapy  This eating plan will help you ease symptoms and make sure you eat enough healthful food.    Recommended Foods   Eat small, frequent meals and snacks.   Limit fat to less than 30% of your total daily calories.    Choose foods that are low in fat: read labels and look for foods that have less than 10 grams fat per serving.    Foods Not Recommended   High-fat foods   Fried foods   Foods with strong odors   Foods that cause gas    Sample 1-Day Menu  Breakfast    cup oatmeal with 1 cup skim milk  2 slices whole wheat toast with 1 teaspoon margarine and  2 teaspoons jam   cup [place] juice  Lunch   1 cup chicken noodle   soup  Turkey sandwich: 2 slices whole wheat bread, 2 ounces turkey, and 1 teaspoon mayonnaise  8 baby carrots  1 apple  1 cup skim milk  Dinner  3 ounces lean roast beef  1 cup potato   cup green beans  1 whole wheat dinner roll with 2 teaspoons margarine  1 [place]   cup pudding made with skim milk  Snack    cup pretzels  Approximate Nutrition Analysis:  Calories: 1,600, Protein: 85g (21% of calories); Carbohydrate: 259g (63% of calories); Fat: 29g (16% of calories); Cholesterol: 107mg; Sodium: 2,191mg; Fiber: 25g

## 2020-08-07 NOTE — Progress Notes (Addendum)
GENERAL SURGERY CONSULTATION  Chief Complaint   Patient presents with    Consultation     Gallstones     Patient WAS wearing a surgical mask and so was her husband  Droplet and Airborne precautions were followed when caring for the patient.   PPE used by provider during encounter: N95 mask, Face Shield/Goggles and Gloves    History of Present Illness:  Leah Campbell is a 65yr-old adult seen in an urgent consultation requested by Dr. Gwenlyn Perking from [place], [place] for problems associated with her gallstones.  She reports having an acute episode of right upper quadrant abdominal pain after having a hearty dinner with ice cream around mid June 2022. The pain started at her right subcostal margin, sharp, colicky, constant, about 9/10 level, radiating to her back and associated with mild nausea, no emesis or fever, and it was somewhat relieved by advil.  It lasted for several days but gradually improved. She did not want to go to the ED for evaluation, and her PCP ordered an ultrasound which confirmed cholelithiasis without evidence of acute cholecystitis. Then she experiences several milder episodes since.  She notices that it may be exacerbated by eating, in particular fatty food.  She reports no history of fever, chills, acholic stool, dark urine or jaundice associated with the episodes.  Her bowel movement has been normal except for diarrhea, but no evidence of steatorrhea.  There is no history of pancreatitis. In 2000, she had an ultrasound showing cholelithiasis then.    Review of Systems:   Constitutional: weight loss for 10 lbs since,  no fever 100 degrees Farentheit or greater, no chills, no repeated shaking chills.  Eyes: negative.  Ears, Nose, Mouth, Throat: negative, no sore throat not a chronic condition, no loss of taste or smell.  CV: negative  Resp: negative, no cough, no shortness of breath of difficulty breathing.  GI: constipation, yes diarrhea.  GU: negative.  Musculoskeletal: negative, no muscle  pain.  Integumentary: negative.  Neuro: negative, no headache.  Endo: negative.  Heme/Lymphatic: negative.  Allergy/Immun: negative.  GYN: negative.    Allergies   Allergen Reactions    Penicillins Rash     Current Outpatient Medications:     drospirenone-estradiol (ANGELIQ) 0.25-0.5 mg Tablet, Takes just twice weekly, Disp: , Rfl:     Fluoxetine (PROZAC) 20 mg tablet, Take 0.5 tablets by mouth every morning., Disp: 30 tablet, Rfl: 1    Past Medical History:   Diagnosis Date    Anxiety 07/03/2008    Cholelithiasis 06/2020    with ruq pain    DM (diabetes mellitus), gestational     with IFG now    Fatigue      Past Surgical History:   Procedure Laterality Date    NO SURGICAL HISTORY       Social History     Socioeconomic History    Marital status: MARRIED     Spouse name: Not on file    Number of children: Not on file    Years of education: Not on file    Highest education level: Not on file   Occupational History    Not on file   Tobacco Use    Smoking status: Never Smoker    Smokeless tobacco: Never Used   Substance and Sexual Activity    Alcohol use: Yes     Comment: when goes out 3-4 glasses home sporatic    Drug use: No    Sexual activity: Not on file  Family History   Problem Relation Name Age of Onset    Cancer Mother          uterine cancer    Other (Psychiatry died of alzheimer's 21yoa) Mother          depression    Other (diabetes living at 61yoa) Father          "old age"- COVID wheelchair for 10 years.    Other (Other-adopted) Brother       I reviewed the patient's past medical and family/social history, and updated them.    Physical Exam:  Vital Signs: BP 130/78 (SITE: right arm, Orthostatic Position: sitting, Cuff Size: regular)   Pulse 79   Temp 36.1 C (96.9 F) (Skin)   Resp 14   Ht 1.6 m (5\' 3" )   Wt 69.4 kg (153 lb)   LMP 03/05/2018 (Within Months)   SpO2 98%   BMI 27.10 kg/m   General appearance: alert and oriented to time, space and person, without acute  distress; appears stated age, normal mood and affect; good insight in her own medical conditions, here with her husband.  Skin: no jaundice or obvious lesion.  HEENT: atraumatic; EOM intact, conjunctiva clear; moist mucous membrane.  Neck: supple, trachea midline, no thyromegaly.  Lungs: comfortable respiration, normal breathing.  Heart: regular rate and rhythm.  Abdomen: soft, non tender; no scar, no hernia, mass, organomegaly or Murphy's sign.  Extremities: FROM, normal strength; no gross deformity.    Laboratory data: LFT: normal on July 01, 2020  Imaging studies: My review of her July 03, 2020 done on July 01, 2020:   Liver:  Unremarkable.   Portal Vein: Unremarkable.   Bile Ducts: Unremarkable.   Gallbladder: Large shadowing nonmobile stones in the neck of the gallbladder. The gallbladder is distended. Normal peak systolic common hepatic artery velocity (61 cm/s).   Free Fluid: No ascites.    Assessment and Recommendation:  Leah Campbell is a 65yr-old adult with symptomatic cholelithiasis.  The patient is educated on the natural history of symptomatic cholelithiasis, the signs and symptoms of cholecystitis, and the difference between those of biliary colic versus acute cholecystitis.  Her symptoms are consistent with classic symptomatic cholelithiasis/chronic cholecystitis.    The patient is offered to consider the following options:    1. Proceed with an elective laparoscopic cholecystectomy.  The indications, risks and benefits of an elective laparoscopic cholecystectomy are discussed.  The risks are low in the elective setting, including injury to the bile ducts (approximately 0.5%), retained stones in the bile ducts, or injury to surrounding organs. The mortality rate in a good-risk patient undergoing elective cholecystectomy is less than 0.1%. Expected clinical outcomes and long-term results are presented.    The rate of conversion to open cholecystectomy and the reasons for such conversion (e.g., extensive adhesions,  difficulty in delineating the anatomy, uncontrolled bleeding or a suspected complication) are explained. The incidence of conversion to an open procedure is between 2-5%, depending on the patient population.     2. Alternative non-operative therapies such as modification of diet or Actigall therapy are also informed together with the pros and cons involved.  Detailed instructions on dietary modification are given (see hand out in instruction section).    She expresses her understanding of the symptoms of cholecystitis (intermittent epigastric/RUQ pain radiating to her right shoulder or scapula, emesis) as well as its signs.  She understands that she would seek immediate medical attention should such symptoms develop.    All of her  questions have been answered.  She verbalizes her understanding of the topics discussed, without any obvious barrier to learning.  She is not quite sure if she would want to proceed with operative management; therefore, a HIDA scan with EF calculation is ordered to see if there is evidence of chronic cholecystitis in order to help her making a decision.  She will return to clinic for further E&M after completion of the test.    Pre-Evaluation Time: 5 minutes  Face-to-Face Time: 30 minutes  Post-Service Time: 5 minutes

## 2020-08-07 NOTE — Nursing Note (Signed)
Vital signs taken, allergies verified, screened for pain, medications reviewed, chief complaint noted, tobacco use reviewVital signs taken, pt and myself were both wearing mask, and all appropriate PPE equipment..  Demere Dotzler.

## 2020-09-09 ENCOUNTER — Ambulatory Visit: Payer: BLUE CROSS/BLUE SHIELD

## 2020-09-14 ENCOUNTER — Other Ambulatory Visit: Payer: Self-pay | Admitting: Internal Medicine

## 2020-09-14 DIAGNOSIS — R002 Palpitations: Secondary | ICD-10-CM

## 2020-09-15 ENCOUNTER — Encounter: Payer: Self-pay | Admitting: Internal Medicine

## 2020-09-15 DIAGNOSIS — R000 Tachycardia, unspecified: Secondary | ICD-10-CM

## 2020-09-15 DIAGNOSIS — R002 Palpitations: Secondary | ICD-10-CM

## 2020-09-15 NOTE — Telephone Encounter (Signed)
From: Allyson Sabal  To: Henrene Dodge, MD  Sent: 09/15/2020 8:58 AM PDT  Subject: cardiology referral    Dr. Gwenlyn Perking,  Could you please send in a cardiology referral for me to [facility], attention Albin Fischer? Thank you very much!

## 2020-09-16 ENCOUNTER — Ambulatory Visit: Payer: BLUE CROSS/BLUE SHIELD | Attending: Internal Medicine

## 2020-09-16 DIAGNOSIS — I493 Ventricular premature depolarization: Secondary | ICD-10-CM

## 2020-09-16 DIAGNOSIS — R002 Palpitations: Secondary | ICD-10-CM

## 2020-09-16 DIAGNOSIS — I491 Atrial premature depolarization: Secondary | ICD-10-CM

## 2020-09-16 NOTE — Nursing Note (Signed)
Vital signs taken, allergies verified, screened for pain, med hx taken. Patient accompanied by him self. PPE, including face shield, gloves, and mask, were worn while in contact with patient. Jefm Petty, MA I    Placed Holter Monitor at 1600  for 48 hours, Patient signed receipt and fully understood all instructions re: log sheet and returning Monitor.

## 2020-09-16 NOTE — Addendum Note (Signed)
Addended by: Edsel Petrin on: 09/16/2020 06:05 PM     Modules accepted: Orders

## 2020-10-01 ENCOUNTER — Ambulatory Visit
Admission: RE | Admit: 2020-10-01 | Discharge: 2020-10-01 | Disposition: A | Discharge status: Home / Self Care | Payer: BLUE CROSS/BLUE SHIELD | Source: Ambulatory Visit

## 2020-10-01 NOTE — Allied Health Procedure (Signed)
Holter scan completed. A preliminary report has been assigned to physician for review.

## 2020-10-23 ENCOUNTER — Encounter: Payer: Self-pay | Admitting: Internal Medicine

## 2020-10-23 DIAGNOSIS — Z1211 Encounter for screening for malignant neoplasm of colon: Secondary | ICD-10-CM

## 2020-10-23 DIAGNOSIS — Z1231 Encounter for screening mammogram for malignant neoplasm of breast: Secondary | ICD-10-CM

## 2020-10-23 NOTE — Telephone Encounter (Signed)
From: Allyson Sabal  To: Henrene Dodge, MD  Sent: 10/23/2020 4:31 PM PDT  Subject: mammogram and colonoscopy    I would like to get referrals for a mammogram and a colonoscopy. Thank you

## 2021-02-10 ENCOUNTER — Encounter: Payer: Self-pay | Admitting: Internal Medicine

## 2021-02-10 NOTE — Telephone Encounter (Signed)
CONSULT MD: Please advise regarding Leah Campbell's PCP status.

## 2021-02-10 NOTE — Telephone Encounter (Signed)
From: Allyson Sabal  To: Henrene Dodge, MD  Sent: 02/10/2021 9:12 AM PST  Subject: My husband needs a new internist    Good morning Dr. Gwenlyn Perking, I am writing to ask you to take my husband, Leah Campbell, on as a patient. His previous physician, Dr. Vivianne Master, left in September without a word to my husband and he didn't find out until yesterday when he was attempting to get a refill on some medications. I hope you are well and having a good week!

## 2021-03-20 ENCOUNTER — Ambulatory Visit: Payer: BLUE CROSS/BLUE SHIELD

## 2021-03-20 ENCOUNTER — Ambulatory Visit
Admission: RE | Admit: 2021-03-20 | Discharge: 2021-03-20 | Disposition: A | Discharge status: Home / Self Care | Payer: BLUE CROSS/BLUE SHIELD | Source: Ambulatory Visit | Attending: Internal Medicine | Admitting: Internal Medicine

## 2021-03-20 DIAGNOSIS — Z1231 Encounter for screening mammogram for malignant neoplasm of breast: Secondary | ICD-10-CM | POA: Insufficient documentation

## 2021-10-16 ENCOUNTER — Encounter: Payer: Self-pay | Admitting: Internal Medicine

## 2021-10-16 NOTE — Telephone Encounter (Signed)
From: [name]  To: Nat Math, MD  Sent: 10/16/2021 8:35 AM PDT  Subject: Leg discomfort and swelling    My right leg is lacking in mobility in the inner thigh going up into the groin accompanied by mild swelling and discoloration at the ankle. The swelling and discoloration comes and goes. I play pickleball and this might be related. Is this somewhat normal or should I be seen?

## 2021-10-16 NOTE — Telephone Encounter (Signed)
Recommended patient call for triage    Attn Staff: Upon return call, please transfer to PCN Triage

## 2022-01-08 ENCOUNTER — Other Ambulatory Visit: Payer: Self-pay

## 2022-04-13 ENCOUNTER — Encounter: Payer: Self-pay | Admitting: Internal Medicine

## 2022-04-13 ENCOUNTER — Ambulatory Visit (INDEPENDENT_AMBULATORY_CARE_PROVIDER_SITE_OTHER): Payer: BLUE CROSS/BLUE SHIELD

## 2022-04-13 ENCOUNTER — Ambulatory Visit: Payer: BLUE CROSS/BLUE SHIELD | Attending: Internal Medicine | Admitting: Internal Medicine

## 2022-04-13 VITALS — BP 134/72 | HR 72 | Temp 97.7°F | Ht 63.0 in | Wt 159.3 lb

## 2022-04-13 DIAGNOSIS — Z0000 Encounter for general adult medical examination without abnormal findings: Secondary | ICD-10-CM

## 2022-04-13 DIAGNOSIS — R002 Palpitations: Secondary | ICD-10-CM | POA: Insufficient documentation

## 2022-04-13 DIAGNOSIS — E663 Overweight: Secondary | ICD-10-CM

## 2022-04-13 LAB — CBC WITH DIFFERENTIAL
Basophils % Auto: 0.6 %
Basophils Abs Auto: 0 10*3/uL (ref 0.0–0.2)
Eosinophils % Auto: 1.4 %
Eosinophils Abs Auto: 0.1 10*3/uL (ref 0.0–0.5)
Hematocrit: 36.6 % (ref 36.0–46.0)
Hemoglobin: 12.3 g/dL (ref 12.0–16.0)
Lymphocytes % Auto: 24.1 %
Lymphocytes Abs Auto: 1.6 10*3/uL (ref 1.0–4.8)
MCH: 29.4 pg (ref 27.0–33.0)
MCHC: 33.5 % (ref 32.0–36.0)
MCV: 87.6 fL (ref 80.0–100.0)
MPV: 8.8 fL (ref 6.8–10.0)
Monocytes % Auto: 5.9 %
Monocytes Abs Auto: 0.4 10*3/uL (ref 0.1–0.8)
Neutrophils % Auto: 68 %
Neutrophils Abs Auto: 4.6 10*3/uL (ref 1.8–7.7)
Platelet Count: 255 10*3/uL (ref 130–400)
RDW: 13.8 % (ref 0.0–14.7)
Red Blood Cell Count: 4.18 10*6/uL (ref 4.00–5.20)
White Blood Cell Count: 6.8 10*3/uL (ref 4.5–11.0)

## 2022-04-13 LAB — COMPREHENSIVE METABOLIC PANEL
Alanine Transferase (ALT): 15 U/L (ref <=33)
Albumin: 4.3 g/dL (ref 4.0–4.9)
Alkaline Phosphatase (ALP): 78 U/L (ref 35–129)
Anion Gap: 9 mmol/L (ref 7–15)
Aspartate Transaminase (AST): 16 U/L (ref <=41)
Bilirubin Total: 0.3 mg/dL (ref <=1.2)
Calcium: 9.1 mg/dL (ref 8.6–10.0)
Carbon Dioxide Total: 26 mmol/L (ref 22–29)
Chloride: 104 mmol/L (ref 98–107)
Creatinine Serum: 0.87 mg/dL (ref 0.51–1.17)
E-GFR Creatinine (Female): 75 mL/min/{1.73_m2}
Glucose: 94 mg/dL (ref 74–109)
Potassium: 4.4 mmol/L (ref 3.4–5.1)
Protein: 7.8 g/dL (ref 6.6–8.7)
Sodium: 139 mmol/L (ref 136–145)
Urea Nitrogen, Blood (BUN): 14 mg/dL (ref 6–20)

## 2022-04-13 LAB — LIPID PANEL WITH DLDL REFLEX
Cholesterol: 193 mg/dL (ref <200)
HDL Cholesterol: 68 mg/dL (ref >40)
LDL Cholesterol Calculation: 114 mg/dL — ABNORMAL HIGH (ref <100)
Non-HDL Cholesterol: 125 mg/dL (ref <=150)
Total Cholesterol: HDL Ratio: 2.8 (ref <4.0)
Triglyceride: 57 mg/dL (ref <150)

## 2022-04-13 LAB — HEMOGLOBIN A1C
Hgb A1C,Glucose Est Avg: 123 mg/dL
Hgb A1C: 5.9 % — ABNORMAL HIGH (ref 3.9–5.6)

## 2022-04-13 LAB — TSH WITH FREE T4 REFLEX: Thyroid Stimulating Hormone: 1.35 u[IU]/mL (ref 0.27–4.20)

## 2022-04-13 LAB — MAGNESIUM (MG): Magnesium (Mg): 2.1 mg/dL (ref 1.6–2.4)

## 2022-04-13 LAB — T3 (TRIIODOTHYRONINE), FREE: T3 (Triiodothyronine), Free: 2.8 pg/mL (ref 1.6–3.9)

## 2022-04-13 LAB — CREATININE SPOT URINE: Creatinine Spot Urine: 230.9 mg/dL

## 2022-04-13 LAB — VITAMIN B12: Vitamin B12: 379 pg/mL (ref 213–816)

## 2022-04-13 NOTE — Patient Instructions (Addendum)
Berberine/chomium can be considered.  Naltrexone can be considered to help with cravings.  Topamax for cravings can also be helpful.   Constipation is a problem with Wegovy  I will put in for referral for [facility] goal would be low dose.   Dr. Eusebio Me is a low functional medicine doctor in whom I have confidence

## 2022-04-13 NOTE — Nursing Note (Signed)
Patient identifiers completed via first/last name and DOB. Vital signs obtained, screened for pain. Verified allergies, tobacco history, pharmacy, and PHQ 2/9.      Leah Ofallon, MA II

## 2022-04-13 NOTE — Nursing Note (Signed)
Opened in error

## 2022-04-13 NOTE — Progress Notes (Addendum)
Chief Complaint   Patient presents with    Heart Problem     Concerns of fluttering     Leah Campbell is a 67yr old female with situational stress with heart fluttering particularly noted when in high stress presents for general follow up and concerns about struggles with weight. She has tried multiple diets and exercises regularly. She has researched GLP1 medications and would like to start one of these.   Feels like fish flipping over in her chest.   Took occasional ativan 0.5mg  prescribed by psychiatrist.   Minimal coffee.   Drinks the mushroom adaptogens every morning.   Seeing a psychiatrist due to husband is new dx of Hodgkin's lymphoma.   Concerns about abdominal fat.  Wine is down to a glass per week.   Trouble breathing on a recent hiking trip  Negative stress test 09/2020.  Echocardiogram- 09/2020- Normal left ventricular size and systolic function. Normal left ventricular size. LV   Ejection Fraction, visually, is 60-65 %.   2. There is mild mitral valve regurgitation.     Current Outpatient Medications on File Prior to Visit   Medication Sig Dispense Refill    Dietary Supplement Capsule When achy takes magnesium      drospirenone-estradiol (ANGELIQ) 0.25-0.5 mg Tablet Takes just twice weekly      Fluoxetine (PROZAC) 20 mg tablet Take 2 tablets by mouth every morning.       No current facility-administered medications on file prior to visit.       Vitals:    04/13/22 0929 04/13/22 0938   BP: (!) 157/71 134/72   SITE: right arm left arm   Orthostatic Position: sitting sitting   Cuff Size: large large   Pulse: 72    Temp: 36.5 C (97.7 F)    TempSrc: Temporal    SpO2: 99%    Weight: 72.3 kg (159 lb 4.8 oz)    Height: 1.6 m (5\' 3" )      Body mass index is 28.22 kg/m.  Physical Exam   Constitutional:  well-developed, well-nourished, and in no distress. No distress.   Cardiovascular: Normal rate, regular rhythm and normal heart sounds.   Pulmonary/Chest: Effort normal and breath sounds normal.No wheezes. No  rales.   Abd: nabs soft non tender.  Musculoskeletal:  no lower extremity edema.     GLP1 prescribed for Weight Management in patients WITHOUT Diabetes - Wegovy, Zepbound, or Saxenda  Eats 3 meals daily-low fat- can't tolerate fat post gallbladder.  Noom 2 years ago- felt like unable to eat much- lost about 7 pounds and felt starving and lightheaded.     Recent measurements at time of prescription:   BMI: Estimated body mass index is 28.22 kg/m as calculated from the following:    Height as of this encounter: 1.6 m (5\' 3" ).    Weight as of this encounter: 72.3 kg (159 lb 4.8 oz).   Wt Readings from Last 5 Encounters:   04/13/22 72.3 kg (159 lb 4.8 oz)   08/07/20 69.4 kg (153 lb)   05/07/20 (P) 74 kg (163 lb 1.6 oz)   04/20/19 (P) 72.6 kg (160 lb)   11/23/10 65.8 kg (145 lb)       Pertinent comorbid conditions:  Other: snores / IFG    Other underlying endocrine causes of obesity ruled out or being treated (as clinically appropriate):  None    History of medications patient has tried for weight loss:  None    Medication is being  used as an adjunct to lifestyle modification: yes   Specific program(s) utilized within the past 12 months, include:  Diet:  noom and Intermittent fasting  Exercise: Building services engineer (weights), Home Exercise Program (pickle ball yoga), and Favorite Physical Activities (pickle ball )  Tracking Devices:    has a home food scale   Behavioral Support/Community Based Programs: Noom    Duration of engagement in listed program(s) within the past year: 6 months or greater    (R00.2) Palpitations  (primary encounter diagnosis)  Much improved since starting on fluoxetine so likely stress related but will check routine labs.         Creatinine Spot Urine, Lipid Panel with DLDL         Reflex, TSH with Free T4 Reflex, CBC with         Differential, Vitamin B12, Vitamin D, 25         Hydroxy, T3 (Triiodothyronine), Free, Magnesium        (Mg), Dietary Supplement Capsule            (E66.3) Overweight  She  would like to start GLP1 medications. Discussed the risks of the medication and that they only work while people take them. 95% of people regain the lost weight within a year of stopping medication. Discussed that GLP1 medications have not been very available x 9 months and when stopped basal dose needs to be restarted.   Will pursue through her insurance as she has tried multiple programs. Also discussed berberine/chromium versus naltrexone or topamax. She is not a good phentermine candidate due to palpitations. Can also consider metformin.   Provider wore a surgical mask  Patient Instructions   Berberine/chomium can be considered.  Naltrexone can be considered to help with cravings.  Topamax for cravings can also be helpful.   Constipation is a problem with Wegovy  I will put in for referral for [facility] goal would be low dose.   Dr. Eusebio Me is a low functional medicine doctor in whom I have confidence      Max Sane MD  [facility]

## 2022-04-15 ENCOUNTER — Encounter: Payer: Self-pay | Admitting: Internal Medicine

## 2022-04-15 MED ORDER — METFORMIN 500 MG TABLET
ORAL_TABLET | ORAL | 0 refills | Status: DC
Start: 2022-04-15 — End: 2022-05-12

## 2022-04-15 NOTE — Telephone Encounter (Signed)
From: [name]  To: Nat Math  Sent: 04/14/2022 6:37 PM PDT  Subject: A1C jump    I am very concerned about how high my A1C currently is and I am wondering what the next steps should be

## 2022-04-15 NOTE — Telephone Encounter (Signed)
CONSULT MD: Please advise.    Please review patient's question regarding labs.  Respond to patient and Close Encounter when completed.

## 2022-04-15 NOTE — Addendum Note (Signed)
Addended by: Margy Clarks on: 04/15/2022 06:33 PM     Modules accepted: Orders

## 2022-04-30 ENCOUNTER — Encounter: Payer: Self-pay | Admitting: Internal Medicine

## 2022-04-30 NOTE — Telephone Encounter (Signed)
From: Leah Campbell  To: Leah Campbell  Sent: 04/30/2022 3:10 PM PDT  Subject: Metformin    Hello, I am not feeling great on the Metformin, it has not affected my appetite but I feel very fatigued. I am hoping you will hear back soon about whether I have been approved for [facility] and I am optimistic that [facility] can do more for me. Thank you.

## 2022-04-30 NOTE — Telephone Encounter (Signed)
Forwarding to Provider to review and respond to patient.  Update on the metformin and follow up with wegovy.

## 2022-05-01 ENCOUNTER — Telehealth: Payer: Self-pay | Admitting: Internal Medicine

## 2022-05-01 MED ORDER — WEGOVY 0.25 MG/0.5 ML SUBCUTANEOUS PEN INJECTOR
0.2500 mg | PEN_INJECTOR | SUBCUTANEOUS | 0 refills | Status: AC
Start: 2022-05-01 — End: 2022-07-30

## 2022-05-01 MED ORDER — WEGOVY 0.5 MG/0.5 ML SUBCUTANEOUS PEN INJECTOR
0.5000 mg | PEN_INJECTOR | SUBCUTANEOUS | 1 refills | Status: DC
Start: 2022-05-29 — End: 2022-09-22

## 2022-05-01 NOTE — Telephone Encounter (Signed)
Please initiate PA for Wegovy-  GLP1 prescribed for Weight Management in patients WITHOUT Diabetes - Wegovy, Zepbound, or Saxenda  Eats 3 meals daily-low fat- can't tolerate fat post gallbladder.  Noom 2 years ago- felt like unable to eat much- lost about 7 pounds and felt starving and lightheaded.      Recent measurements at time of prescription:   BMI: Estimated body mass index is 28.22 kg/m as calculated from the following:    Height as of this encounter: 1.6 m (5\' 3" ).    Weight as of this encounter: 72.3 kg (159 lb 4.8 oz).       Wt Readings from Last 5 Encounters:   04/13/22 72.3 kg (159 lb 4.8 oz)   08/07/20 69.4 kg (153 lb)   05/07/20 (P) 74 kg (163 lb 1.6 oz)   04/20/19 (P) 72.6 kg (160 lb)   11/23/10 65.8 kg (145 lb)         Pertinent comorbid conditions:  Other: snores / IFG     Other underlying endocrine causes of obesity ruled out or being treated (as clinically appropriate):  None     History of medications patient has tried for weight loss:  None     Medication is being used as an adjunct to lifestyle modification: yes   Specific program(s) utilized within the past 12 months, include:  Diet:  noom and Intermittent fasting  Exercise: [employer] Membership (weights), Home Exercise Program (pickle ball yoga), and Favorite Physical Activities (pickle ball )  Tracking Devices:    has a home food scale   Behavioral Support/Community Based Programs: Noom     Duration of engagement in listed program(s) within the past year: 6 months or greater      Tried metformin but did not feel well on it. Marcello Moores MD

## 2022-05-08 ENCOUNTER — Other Ambulatory Visit: Payer: Self-pay | Admitting: Internal Medicine

## 2022-05-10 NOTE — Telephone Encounter (Signed)
Requested Prescriptions     Pending Prescriptions Disp Refills    Metformin (GLUCOPHAGE) 500 mg Tablet [Pharmacy Med Name: METFORMIN HCL 500 MG TABLET] 138 tablet 1     Sig: TAKE WITH MEALS, 0.5 TAB BY MOUTH 2 TIMES DAILY FOR 14 DAYS, THEN 1 TAB 2 TIMES DAILY THEREAFTER       Last visit: 04/13/2022. Future Visit date not found  Medication last prescribed on: 04/15/22     Thank you,  Jake Shark, MA

## 2022-05-15 ENCOUNTER — Other Ambulatory Visit: Payer: Self-pay

## 2022-05-15 NOTE — Telephone Encounter (Signed)
The [facility] PA Team received a request to obtain authorization from the patient's insurance for Wegovy 0.25mg/0.5mL Pen . You can view its status in the "Referral/Authorizations" tab or through medication links in Storyboard.    If approved, we will attempt to notify the patient.  If denied, we will notify the clinic/provider with the reason. You will be responsible for discussing the care plan with the patient.

## 2022-05-17 ENCOUNTER — Encounter: Payer: Self-pay | Admitting: Internal Medicine

## 2022-05-17 NOTE — Telephone Encounter (Signed)
I assume that records were forwarded to insurance correct? The way this is worded it sounds like records were not forwarded. Only medication condition related is overweight with IFG and borderline blood pressure elevation. Marcello Moores MD

## 2022-05-17 NOTE — Telephone Encounter (Signed)
Prior authorization has been DENIED by the patient's insurance. We notified the provider/clinic to plan for the next steps in care. The reason(s) for denial: Does NOT meet insurance's coverage criteria: patient must have a weight related health condition        -[employer] PA Team

## 2022-05-20 NOTE — Telephone Encounter (Signed)
Acknowledged and closed.  FYI to MD

## 2022-05-22 NOTE — Telephone Encounter (Signed)
PA resubmitted for reconsideration Wegovy 0.25mg /0.11mL on 05/22/22. Pending response.

## 2022-05-31 NOTE — Telephone Encounter (Signed)
Prior authorization has been DENIED by the patient's insurance. We notified the provider/clinic to plan for the next steps in care. The reason(s) for denial: Does NOT meet insurance's coverage criteria:      Per insurance, the medical records and charts noted normal results from recent tests and labs. Additionally there were no documented comorbid conditions such as hyperlipidemia , HTN, DM, in addition to the BMI of 28.22.       -[employer] PA Team

## 2022-07-25 ENCOUNTER — Encounter: Payer: Self-pay | Admitting: Internal Medicine

## 2022-08-02 ENCOUNTER — Encounter: Payer: Self-pay | Admitting: Internal Medicine

## 2022-08-02 NOTE — Telephone Encounter (Signed)
Triage recommended    Attention Staff: Upon return call, please transfer to PCN Nurse Triage

## 2022-08-02 NOTE — Telephone Encounter (Signed)
From: Allyson Sabal  To: Henrene Dodge  Sent: 08/02/2022 8:25 AM PDT  Subject: Nipple change    This morning I noticed a slight but different swelling around my right nipple. Would my next step be you ordering me a mammogram?

## 2022-08-03 ENCOUNTER — Ambulatory Visit: Payer: Self-pay | Admitting: Internal Medicine

## 2022-08-03 NOTE — Telephone Encounter (Signed)
Patient called back, transferred to triage      Leah Campbell  PSR II   Patient Contact Center   PH: [phone]

## 2022-08-03 NOTE — Telephone Encounter (Signed)
3 patient identifiers used.  Per:   patient.    Disposition: SEE PCP WITHIN 3 DAYS   Appointment given per protocol   Per:   patient verbalizes agreement to plan. Agrees to callback with any increase in symptoms/concerns or questions.    See Assessment Below  Patient, noticed a change in her right breast when she got out of the shower yesterday. It looked a little distorted. No changes in the nipple area. No pain. No redness. No fever. No lumps. No SOB or CP. Pt speaking in full, complete sentences with clear speech. No wheezing or stridor heard during triage. Advised to return call for any new or worsening concerns.    See Care Advice Below  Care Advice            Breast Howie Ill, RN Tue Aug 03, 2022 10:42 AM      Care Advice       SEE PCP WITHIN 3 DAYS:  * You need to be seen within 2 or 3 days.  * PCP VISIT: Call your doctor (or NP/PA) during regular office hours and make an appointment. A clinic or urgent care center are good places to go for care if your doctor's office is closed or you can't get an appointment. NOTE: If office will be open tomorrow, tell caller to call then, not in 3 days.  * IF PATIENT HAS NO PCP: A clinic or urgent care center are good places to go for care if you do not have a primary care provider. NOTE: Try to help caller find a PCP for future care (e.g., use a physician referral line). Having a PCP or 'medical home' means better long-term care.    CARE ADVICE given per Breast Symptoms (Adult) guideline.                              See Protocol and Disposition Below  Reason for Disposition   Change in shape or appearance of breast    Additional Information   Negative: Chest pain   Negative: Breastfeeding questions about baby   Negative: Breastfeeding questions about mother (breast symptoms or feeling sick)   Negative: Breastfeeding questions about mother's medicines and diet   Negative: Postpartum breast pain and swelling, not breastfeeding   Negative: Small spot, skin  growth or mole   Negative: [1] SEVERE breast pain AND [2] fever > 103 F (39.4 C)   Negative: [1] Breast looks infected (spreading redness, feels hot or painful to touch) AND [2] fever   Negative: [1] Breast looks infected (spreading redness, feels hot or painful to touch) AND [2] no fever   Negative: [1] Painful rash AND [2] multiple small blisters grouped together (i.e., dermatomal distribution or "band" or "stripe")   Negative: [1] Cuts, burns, or bruises of breasts AND [2] suspicious history for the injury   Negative: Dry flaking-peeling skin of nipple   Negative: Breast lump   Negative: [1] Nipple discharge AND [2] bloody   Negative: Nipple is inverted (i.e., points inward)  (Exception: Long-term physical characteristic, present for many years.)    Protocols used: Breast Symptoms-ADULT-AH

## 2022-08-05 ENCOUNTER — Ambulatory Visit: Payer: BLUE CROSS/BLUE SHIELD | Admitting: Internal Medicine

## 2022-08-05 ENCOUNTER — Encounter: Payer: Self-pay | Admitting: Internal Medicine

## 2022-08-05 VITALS — BP 138/70 | HR 68 | Temp 97.7°F | Ht 63.0 in | Wt 157.5 lb

## 2022-08-05 DIAGNOSIS — N6459 Other signs and symptoms in breast: Secondary | ICD-10-CM

## 2022-08-05 NOTE — Nursing Note (Signed)
Patient identifiers completed via first/last name and DOB. Vital signs obtained, screened for pain. Verified allergies, tobacco history, pharmacy, and PHQ 2/9.      Shannon Manzanares, MA II

## 2022-08-05 NOTE — Patient Instructions (Signed)
Diagnostic mammo [phone] for downtown  Monitor symptoms

## 2022-08-05 NOTE — Progress Notes (Signed)
Chief Complaint   Patient presents with    Breast Problem     Nipple - change in shape     Leah Campbell is a 67yr old female with nipple changes earlier this week without itching. Does look improved to her. There is no FH of breast cancer. Diet is good.  Current Outpatient Medications on File Prior to Visit   Medication Sig Dispense Refill    Dietary Supplement Capsule When achy takes magnesium      drospirenone-estradiol (ANGELIQ) 0.25-0.5 mg Tablet Takes just twice weekly      Fluoxetine (PROZAC) 20 mg tablet Take 2 tablets by mouth every morning.      Metformin (GLUCOPHAGE) 500 mg Tablet Take 1 tablet by mouth 2 times daily with meals. 180 tablet 3    WEGOVY 0.5 mg/0.5 mL Pen Injector Inject 0.5 mg subcutaneously one time each week. 2 mL 1     No current facility-administered medications on file prior to visit.     Vitals:    08/05/22 1056   BP: 138/70   SITE: left arm   Orthostatic Position: sitting   Cuff Size: regular   Pulse: 68   Temp: 36.5 C (97.7 F)   TempSrc: Temporal   SpO2: 99%   Weight: 71.4 kg (157 lb 8 oz)   Height: 1.6 m (5\' 3" )    Well appearing nad  Heart is regular  Prominent vein both breasts medially no palpable masses breasts or axillae.   (N64.59) Nipple problem  (primary encounter diagnosis)  Diagnostic mammo and monitor  Plan: BREAST MAMMOGRAM DIAGNOSTIC BILAT W/TOMO          Provider wore a surgical mask  Patient Instructions   Diagnostic mammo [phone] for downtown  Monitor symptoms      Marcello Moores MD  [facility] Internal Medicine-Community Physician

## 2022-08-09 ENCOUNTER — Other Ambulatory Visit: Payer: BLUE CROSS/BLUE SHIELD

## 2022-08-13 ENCOUNTER — Ambulatory Visit (LOCAL_COMMUNITY_HEALTH_CENTER): Payer: Self-pay

## 2022-08-13 DIAGNOSIS — Z111 Encounter for screening for respiratory tuberculosis: Secondary | ICD-10-CM

## 2022-08-16 ENCOUNTER — Ambulatory Visit (LOCAL_COMMUNITY_HEALTH_CENTER): Payer: Self-pay

## 2022-08-16 DIAGNOSIS — Z111 Encounter for screening for respiratory tuberculosis: Secondary | ICD-10-CM

## 2022-09-14 ENCOUNTER — Encounter: Payer: Self-pay | Admitting: Internal Medicine

## 2022-09-14 DIAGNOSIS — E663 Overweight: Secondary | ICD-10-CM

## 2022-09-14 NOTE — Telephone Encounter (Signed)
Forwarding to Provider to review and respond to patient.  Medication Change:    Requesting a change in the prescription for WEGOVY 0.5 mg/0.5 mL Pen Injector .  The current dose is 0.5mg  and the instructions are Inject 0.5 mg subcutaneously one time each week. .  The reason for changing the medication is because medication not covered by insurance.  The patient requests alternate medication; Zepbound

## 2022-09-22 MED ORDER — ZEPBOUND 5 MG/0.5 ML SUBCUTANEOUS PEN INJECTOR
5.0000 mg | PEN_INJECTOR | SUBCUTANEOUS | 3 refills | Status: DC
Start: 2022-10-20 — End: 2022-11-03

## 2022-09-22 MED ORDER — ZEPBOUND 2.5 MG/0.5 ML SUBCUTANEOUS PEN INJECTOR
2.5000 mg | PEN_INJECTOR | SUBCUTANEOUS | 0 refills | Status: DC
Start: 2022-09-22 — End: 2022-11-22

## 2022-09-22 MED ORDER — WEGOVY 0.5 MG/0.5 ML SUBCUTANEOUS PEN INJECTOR
0.5000 mg | PEN_INJECTOR | SUBCUTANEOUS | 5 refills | Status: DC
Start: 2022-09-22 — End: 2022-09-22

## 2022-09-22 NOTE — Addendum Note (Signed)
Addended by: Edsel Petrin on: 09/22/2022 05:35 AM     Modules accepted: Orders

## 2022-09-22 NOTE — Addendum Note (Signed)
Addended by: Edsel Petrin on: 09/22/2022 05:31 AM     Modules accepted: Orders

## 2022-09-30 ENCOUNTER — Other Ambulatory Visit: Payer: Self-pay

## 2022-09-30 ENCOUNTER — Telehealth: Payer: Self-pay | Admitting: Internal Medicine

## 2022-09-30 DIAGNOSIS — E663 Overweight: Secondary | ICD-10-CM

## 2022-09-30 NOTE — Telephone Encounter (Signed)
Medication Change:    Requesting a change in the prescription for ZEPBOUND 2.5 mg/0.5 mL Pen Injector [[id number]]    Order Details  Dose: 2.5 mg Route: SUBCUTANEOUS Frequency: 1X WEEKLY (OUTPATIENT)   Dispense Quantity: 2 mL Refills: 0    Note to Pharmacy: Per patient I am used to use this NPI :[id number] (my NPI is [id number]) she will be buying directly from [facility] NCPDP: [id number]         Sig: Inject 0.5 mL subcutaneously one time each week. For weeks 1-4   .   Patient is calling stating that she needs to have this sent over as a vial and not a pen injector as she does cash pay and needs the vial.        Steva Ready II   Patient Contact Center   [facility]: [phone]

## 2022-10-01 NOTE — Telephone Encounter (Signed)
Recent Visits  Date Type Provider Dept   08/05/22 Office Visit Mateo Flow, MD [facility]   04/13/22 Office Visit Kordas, Kara Mead, MD [facility]   Showing recent visits within past 540 days with a meds authorizing provider and meeting all other requirements  Future Appointments  No visits were found meeting these conditions.  Showing future appointments within next 150 days with a meds authorizing provider and meeting all other requirements      Last Ordered: 09/22/22 (patient is asking for vial instead of pen)     Jake Shark, MA

## 2022-10-05 NOTE — Telephone Encounter (Signed)
Patient called and requested an update. Advised patient will resend message.     Jake Shark, MA

## 2022-10-07 MED ORDER — ZEPBOUND 2.5 MG/0.5 ML SUBCUTANEOUS SOLUTION
SUBCUTANEOUS | 0 refills | Status: DC
Start: 2022-10-07 — End: 2022-11-22

## 2022-10-07 MED ORDER — ZEPBOUND 5 MG/0.5 ML SUBCUTANEOUS SOLUTION
5.0000 mg | SUBCUTANEOUS | 1 refills | Status: DC
Start: 2022-10-07 — End: 2022-11-03

## 2022-10-07 MED ORDER — INSULIN SYRINGE U-100 WITH NEEDLE 1 ML 30 GAUGE X 1/2"
INJECTION | 1 refills | Status: AC
Start: 2022-10-07 — End: 2024-10-04

## 2022-10-07 NOTE — Telephone Encounter (Signed)
I have re-sent Zepbound to [facility] direct per request with insulin syringes. Marcello Moores MD

## 2022-10-18 ENCOUNTER — Other Ambulatory Visit: Payer: Self-pay | Admitting: Internal Medicine

## 2022-10-18 DIAGNOSIS — E663 Overweight: Secondary | ICD-10-CM

## 2022-10-19 NOTE — Telephone Encounter (Signed)
Duplicate

## 2022-10-22 ENCOUNTER — Encounter: Payer: Self-pay | Admitting: Internal Medicine

## 2022-10-22 DIAGNOSIS — E663 Overweight: Secondary | ICD-10-CM

## 2022-10-22 NOTE — Telephone Encounter (Signed)
Refill Request  Forwarding to Clinical staff to address.      [MA:  Create an Encounter and use .MCMEDREFILL]

## 2022-11-02 NOTE — Telephone Encounter (Signed)
Advice:    Patient is calling stating that her pharmacy is stating they never received any of the refills of the medication that PCP sent to them. She is requesting this to be done today as her next injection is Friday. She would like a call once done      Steva Ready II   Patient Contact Center   [facility]: [phone]

## 2022-11-03 ENCOUNTER — Telehealth: Payer: Self-pay | Admitting: Internal Medicine

## 2022-11-03 DIAGNOSIS — E663 Overweight: Secondary | ICD-10-CM

## 2022-11-03 MED ORDER — ZEPBOUND 5 MG/0.5 ML SUBCUTANEOUS PEN INJECTOR
5.0000 mg | PEN_INJECTOR | SUBCUTANEOUS | 3 refills | Status: AC
Start: 2022-11-03 — End: 2023-05-02

## 2022-11-03 MED ORDER — ZEPBOUND 5 MG/0.5 ML SUBCUTANEOUS SOLUTION
5.0000 mg | SUBCUTANEOUS | 4 refills | Status: DC
Start: 2022-11-03 — End: 2023-02-11

## 2022-11-03 NOTE — Telephone Encounter (Signed)
Recent Visits  Date Type Provider Dept   08/05/22 Office Visit Mateo Flow, MD [facility]   04/13/22 Office Visit Kordas, Kara Mead, MD [facility]   Showing recent visits within past 540 days with a meds authorizing provider and meeting all other requirements  Future Appointments  No visits were found meeting these conditions.  Showing future appointments within next 150 days with a meds authorizing provider and meeting all other requirements      Last Ordered: 10/20/22 ( see patient message )    Jake Shark, MA

## 2022-11-03 NOTE — Telephone Encounter (Signed)
Advice:    Requesting advice about the prescription of   ZEPBOUND 5 mg/0.5 mL Pen Injector [[id number]]    Order Details  Dose: 5 mg Route: SUBCUTANEOUS Frequency: 1X WEEKLY (OUTPATIENT)   Dispense Quantity: 2 mL Refills: 3    Note to Pharmacy: Please expedite. She will need on 10/18         Sig: Inject 0.5 mL subcutaneously one time each week. After week 4 until changed.       Pharmacy called and stated the Order needs to read 2ml not 6 ml and insulin supplies not needed provided by pharmacy . Order received shows 6ml which is including the refills     Ph. [phone] opt 7    Signa Kell  PSRII

## 2022-11-03 NOTE — Telephone Encounter (Signed)
General Status:    Requesting a status of their refill for  zepbound she states that she needs this medication before Friday which is her next dose day . Please assist as she has been messaging since 10/22/22    Patient would also like a phone call back once this order is placed at [phone]  Patient is requesting this correct prescription number be placed: [id number] this is the prescription number     [facility]:  Verify that the prescription was not already called in, then route to MA pool.    MA:  Sort medication history list by date, select the last prescribed version of the medication. Pend refill and route to Provider].       Leah Campbell [place]  PSR II

## 2022-11-03 NOTE — Telephone Encounter (Signed)
Sent again. Marcello Moores MD

## 2022-11-22 ENCOUNTER — Encounter: Payer: Self-pay | Admitting: Internal Medicine

## 2022-11-22 ENCOUNTER — Ambulatory Visit: Payer: Medicare Other | Admitting: Internal Medicine

## 2022-11-22 VITALS — BP 132/82 | HR 86 | Temp 98.0°F | Resp 15 | Ht 63.0 in | Wt 149.2 lb

## 2022-11-22 DIAGNOSIS — R7301 Impaired fasting glucose: Secondary | ICD-10-CM

## 2022-11-22 DIAGNOSIS — M7601 Gluteal tendinitis, right hip: Secondary | ICD-10-CM

## 2022-11-22 DIAGNOSIS — E538 Deficiency of other specified B group vitamins: Secondary | ICD-10-CM

## 2022-11-22 DIAGNOSIS — I10 Essential (primary) hypertension: Secondary | ICD-10-CM

## 2022-11-22 DIAGNOSIS — Z1389 Encounter for screening for other disorder: Secondary | ICD-10-CM

## 2022-11-22 DIAGNOSIS — Z79899 Other long term (current) drug therapy: Secondary | ICD-10-CM

## 2022-11-22 DIAGNOSIS — E7800 Pure hypercholesterolemia, unspecified: Secondary | ICD-10-CM

## 2022-11-22 NOTE — Nursing Note (Signed)
Patient identifiers completed via first/last name and DOB. Vital signs obtained, screened for pain. Verified allergies, tobacco history, pharmacy, and PHQ 2/9.      Linsey Hirota, MA II

## 2022-11-22 NOTE — Progress Notes (Signed)
Chief Complaint   Patient presents with    Skin Problem     Mole on R shoulder      ASSESSMENT & PLAN      Assessment & Plan  Seborrheic Keratosis reassurance. She does have freckles and higher risk skin and would benefit from dermatology skin check.   -Referral to dermatology for a skin check.    (Z79.899) Medication management/(R73.01) IFG (impaired fasting glucose)  Weight Loss  Significant weight loss of 14 pounds reported, on Zepbound and reduced alcohol consumption. No reported side effects.  -Continue Zepbound.  -Check A1c and cholesterol in two months (fasting).    (I10) Hypertension, unspecified type  Elevated blood pressure noted today, possibly due to acute stress. [name] is currently on medication. Check b/p at home.   -Check blood pressure at home.  -Continue current medication.     (M76.01) Gluteal tendinitis of right buttock  (primary encounter diagnosis)  Plan: Physical Therapy Referral, Comprehensive         Metabolic PanelGluteal muscle Injury  Injury occurred in the summer, due to pickle ball causing limitations in exercise. Some improvement noted with stretching and working with a trainer.  -Referral to [facility], including massage in the treatment plan.    General Health Maintenance  -Schedule mammogram.  -Complete advanced directive.  -Follow-up in six months.     The patient /  surrogate was counseled about the importance of Advance Care Planning. In addition:  There are no ACP documents/ surrogate in the EHR. The patient was given information about how to create and file ACP documents   Provider wore a surgical mask  Patient Instructions   Labs fasting in 2 months.  Mammogram soon [phone]  Right chest wall looks to be a seborrheic keratosis    Referral to derm  Advanced directives         Marcello Moores MD  [facility] Internal Medicine-Community Physician             SUBJECTIVE      Leah Campbell is a 68yr old female who presents for a mole check.  She does not think  about food and cravings for alcohol or food are markedly better on .  tirzepatide 5mg  once weekly (2 doses).  She injured her right hamstring in pickle ball several weeks ago.  Husband has refractory cancer. Caria has stress related to husband's illness.     History of Present Illness  The patient, with a history of hypertension and a previous left hip/hamstring injury, presents with a mole that has changed slightly.     The patient's blood pressure was slightly elevated at the time of the visit, which she attributes to a stressful incident earlier in the day. She is monitoring her blood pressure at home.    The patient also reports a previous hamstring injury that occurred over the summer while playing pickleball. The injury has improved but still causes discomfort and has limited the herability to exercise.    The patient is on Zepbound for weight loss and reports significant improvement in her eating and drinking habits. She has lost about 14 pounds and reports decreased cravings for food and alcohol. The patient also reports improved sleep and cessation of snoring.    Holden is under significant stress due to her husband's health issues. Her husband has undergone multiple rounds of chemotherapy for lymphoma and found it is resistant.   She worries about his health and needs to be available  to help him.       Current Outpatient Medications on File Prior to Visit   Medication Sig Dispense Refill    Dietary Supplement Capsule When achy takes magnesium      drospirenone-estradiol (ANGELIQ) 0.25-0.5 mg Tablet Takes just twice weekly      Fluoxetine (PROZAC) 20 mg tablet Take 2 tablets by mouth every morning.      Insulin Syringe-Needle U-100 (B-D INS SYR ULTRAFINE 1CC/30G) 1 mL 30 gauge x 1/2" for insulin administration with Zepbound once weekly 50 each 1    tirzepatide, weight loss, (ZEPBOUND) 5 mg/0.5 mL Solution Inject 0.5 mL subcutaneously one time each week. NPI :[id number] NCPDP: [id number] 2 mL 4    ZEPBOUND 5  mg/0.5 mL Pen Injector Inject 0.5 mL subcutaneously one time each week. After week 4 until changed. 2 mL 3     No current facility-administered medications on file prior to visit.     OBJECTIVE      Her height is 1.6 m (5\' 3" ) and weight is 67.7 kg (149 lb 3.2 oz). Her temporal temperature is 36.7 C (98 F). Her blood pressure is 132/82 and her pulse is 86. Her oxygen saturation is 99%.   Body mass index is 26.43 kg/m.      Physical Exam  VITALS: BP- 132/80  MUSCULOSKELETAL: Tenderness in right gluteal region and lateral aspect of hip. No tenderness over ischial tuberosity or medial piriformis. Mild tenderness in posterior thigh.  SKIN: Lesion  right lateral superior chest (near clavicle) consistent with seborrheic keratosis  Heart is regular       Results      I obtained verbal consent from the patient to use AI ambient technology to transcribe the interactions between the patient and myself during the clinical encounter.

## 2022-11-22 NOTE — Patient Instructions (Addendum)
Labs fasting in 2 months.  Mammogram soon [phone]  Right chest wall looks to be a seborrheic keratosis    Referral to derm  Advanced directives

## 2022-12-29 ENCOUNTER — Telehealth: Payer: Self-pay | Admitting: Internal Medicine

## 2022-12-29 NOTE — Telephone Encounter (Signed)
Referral Request:    Patient called, stated [facility] did not received the referral and requesting if PCP can send it over to [facility].     Please advise       [facility]  Fax# [phone]      [facility]   Phone: [phone]      Christene Lye   Patient Contact Center II

## 2023-01-24 ENCOUNTER — Ambulatory Visit: Payer: Medicare PPO | Admitting: Internal Medicine

## 2023-02-08 ENCOUNTER — Ambulatory Visit: Payer: Medicare Other | Attending: Family Medicine

## 2023-02-08 DIAGNOSIS — E538 Deficiency of other specified B group vitamins: Secondary | ICD-10-CM | POA: Insufficient documentation

## 2023-02-08 DIAGNOSIS — R7301 Impaired fasting glucose: Secondary | ICD-10-CM | POA: Insufficient documentation

## 2023-02-08 DIAGNOSIS — M7601 Gluteal tendinitis, right hip: Secondary | ICD-10-CM | POA: Insufficient documentation

## 2023-02-08 DIAGNOSIS — E7800 Pure hypercholesterolemia, unspecified: Secondary | ICD-10-CM | POA: Insufficient documentation

## 2023-02-08 DIAGNOSIS — Z0000 Encounter for general adult medical examination without abnormal findings: Secondary | ICD-10-CM | POA: Insufficient documentation

## 2023-02-08 DIAGNOSIS — Z79899 Other long term (current) drug therapy: Secondary | ICD-10-CM

## 2023-02-08 LAB — VITAMIN D, 25 HYDROXY: Vitamin D, 25 Hydroxy: 24.6 ng/mL (ref 10.0–50.0)

## 2023-02-08 LAB — COMPREHENSIVE METABOLIC PANEL
Alanine Transferase (ALT): 12 U/L (ref <=33)
Albumin: 4.4 g/dL (ref 4.0–4.9)
Alkaline Phosphatase (ALP): 68 U/L (ref 35–129)
Anion Gap: 8 mmol/L (ref 7–15)
Aspartate Transaminase (AST): 14 U/L (ref <=41)
Bilirubin Total: 0.3 mg/dL (ref <=1.2)
Calcium: 9.1 mg/dL (ref 8.6–10.0)
Carbon Dioxide Total: 23 mmol/L (ref 22–29)
Chloride: 100 mmol/L (ref 98–107)
Creatinine Serum: 0.83 mg/dL (ref 0.51–1.17)
E-GFR Creatinine (Female): 78 mL/min/{1.73_m2}
Glucose: 90 mg/dL (ref 74–109)
Potassium: 3.7 mmol/L (ref 3.4–5.1)
Protein: 7.2 g/dL (ref 6.6–8.7)
Sodium: 131 mmol/L — ABNORMAL LOW (ref 136–145)
Urea Nitrogen, Blood (BUN): 9 mg/dL (ref 6–20)

## 2023-02-08 LAB — HEMOGLOBIN A1C
Hgb A1C,Glucose Est Avg: 108 mg/dL
Hgb A1C: 5.4 % (ref 3.9–5.6)

## 2023-02-08 LAB — LIPID PANEL WITH DLDL REFLEX
Cholesterol: 175 mg/dL (ref <200)
HDL Cholesterol: 65 mg/dL (ref >40)
LDL Cholesterol Calculation: 95 mg/dL (ref <100)
Non-HDL Cholesterol: 110 mg/dL (ref <=150)
Total Cholesterol: HDL Ratio: 2.7 (ref <4.0)
Triglyceride Level: 74 mg/dL (ref <150)

## 2023-02-08 LAB — VITAMIN B12: Vitamin B12: 459 pg/mL (ref 213–816)

## 2023-02-08 LAB — LIPASE: Lipase: 78 U/L — ABNORMAL HIGH (ref 13–60)

## 2023-02-10 ENCOUNTER — Encounter: Payer: Self-pay | Admitting: Internal Medicine

## 2023-02-10 ENCOUNTER — Ambulatory Visit: Payer: Medicare Other | Admitting: Internal Medicine

## 2023-02-10 ENCOUNTER — Other Ambulatory Visit: Payer: Self-pay | Admitting: Internal Medicine

## 2023-02-10 VITALS — BP 99/63 | HR 90 | Temp 97.8°F | Ht 64.0 in | Wt 136.3 lb

## 2023-02-10 DIAGNOSIS — E663 Overweight: Secondary | ICD-10-CM

## 2023-02-10 DIAGNOSIS — R7301 Impaired fasting glucose: Secondary | ICD-10-CM

## 2023-02-10 DIAGNOSIS — Z1231 Encounter for screening mammogram for malignant neoplasm of breast: Secondary | ICD-10-CM

## 2023-02-10 NOTE — Nursing Note (Signed)
Patient identifiers completed via first/last name and DOB. Vital signs obtained, screened for pain. Verified allergies, tobacco history, pharmacy, and PHQ 2/9.      Linsey Hirota, MA II

## 2023-02-10 NOTE — Patient Instructions (Addendum)
Can consider adding wellbutrin and or metformin to help with appetite to prozac low dose when weaning Zepbound  Labs   Protein fat prior to carbs   email : [email or web address] or phone [phone] option 3 or fax [phone]  to [employer] - [phone]

## 2023-02-10 NOTE — Progress Notes (Signed)
Answers submitted by the patient for this visit:  Patient Health Questionnaire (Submitted on 02/10/2023)  PHQ-2 Score:: 1  Other (Submitted on 02/10/2023)  Chief Complaint: OTHER CHECKLIST  Please describe your symptoms or the medical condition for which you are being seen: Follow up after being on Zepbound since September  Have you had these symptoms or been seen for this before?: No  Please list any medications you are currently taking for this condition.: no symptoms  Please describe any probable cause for these symptoms. : n/a  How long have you been having these symptoms or issue?: more than 1 month ago    Leah Campbell is a 68yr old female with situational stress due to husband's illness who also has struggled with her weight particularly when she takes anxiety medications like fluoxetine but has been doing quite well on tirzepatide.   She has been doing regular weight lifting and cardiovascular exercise.   She would like to do her next mammogram at [facility].   Medications Ordered Prior to Encounter[1]    Vitals:    02/10/23 1502   BP: 99/63   SITE: left arm   Orthostatic Position: sitting   Cuff Size: regular   Pulse: 90   Temp: 36.6 C (97.8 F)   TempSrc: Temporal   SpO2: 100%   Weight: 61.8 kg (136 lb 4.8 oz)   Height: 1.626 m (5\' 4" )    159  Body mass index is 23.4 kg/m.  Physical Exam   Constitutional:  well-developed, well-nourished, and in no distress. No distress.   Cardiovascular: Normal rate, regular rhythm and normal heart sounds.   No murmur heard.  Musculoskeletal:  no lower extremity edema.   (R73.01) IFG (impaired fasting glucose)  (primary encounter diagnosis)  May want to consider weaning medication or decreasing over the next 6-9 months. Dietary referral to help with excessive hunger when not on medication.   Plan: Dietary Referral            Provider wore a surgical mask  Patient Instructions   Can consider adding wellbutrin and or metformin to help with appetite to prozac low dose when weaning  Zepbound  Labs   Protein fat prior to carbs   email : [email or web address] or phone [phone] option 3 or fax [phone]  to Lorenza Evangelist - [phone]      Marcello Moores MD  [facility] Internal Medicine-Community Physician          [1]   Current Outpatient Medications on File Prior to Visit   Medication Sig Dispense Refill    Dietary Supplement Capsule When achy takes magnesium      drospirenone-estradiol (ANGELIQ) 0.25-0.5 mg Tablet Takes just twice weekly      Fluoxetine (PROZAC) 20 mg tablet Take 2 tablets by mouth every morning.      Insulin Syringe-Needle U-100 (B-D INS SYR ULTRAFINE 1CC/30G) 1 mL 30 gauge x 1/2" for insulin administration with Zepbound once weekly 50 each 1    ZEPBOUND 5 mg/0.5 mL Pen Injector Inject 0.5 mL subcutaneously one time each week. After week 4 until changed. 2 mL 3     No current facility-administered medications on file prior to visit.

## 2023-02-11 LAB — METHYLMALONIC ACID LEVEL: Methylmalonic Acid Level: <0.1 umol/L (ref 0.00–0.40)

## 2023-02-11 NOTE — Telephone Encounter (Signed)
Recent Visits  Date Type Provider Dept   02/10/23 Office Visit Mateo Flow, MD [facility]   11/22/22 Office Visit Kordas, Kara Mead, MD [facility]   08/05/22 Office Visit Kordas, Kara Mead, MD [facility]   04/13/22 Office Visit Gwenlyn Perking, Kara Mead, MD [facility]   Showing recent visits within past 540 days with a meds authorizing provider and meeting all other requirements  Future Appointments  Date Type Provider Dept   05/23/23 Appointment Gwenlyn Perking, Kara Mead, MD [facility]   Showing future appointments within next 150 days with a meds authorizing provider and meeting all other requirements      Last Ordered: 11/03/22    Valera Castle, MA

## 2023-03-17 ENCOUNTER — Encounter: Payer: Self-pay | Admitting: Internal Medicine

## 2023-03-17 ENCOUNTER — Telehealth: Payer: Self-pay | Admitting: Internal Medicine

## 2023-03-17 DIAGNOSIS — E663 Overweight: Secondary | ICD-10-CM

## 2023-03-17 MED ORDER — ZEPBOUND 2.5 MG/0.5 ML SUBCUTANEOUS SOLUTION
SUBCUTANEOUS | 11 refills | Status: AC
Start: 2023-03-17 — End: 2023-06-15

## 2023-03-17 NOTE — Telephone Encounter (Signed)
 Sent prescription and message to [facility] letting her know. Marcello Moores MD

## 2023-03-17 NOTE — Telephone Encounter (Signed)
 Medication Change:    Requesting a change in the prescription for ZEPBOUND.  The current dose is 5 mg/0.5 mL Pen Injector and the instructions are Sig: Inject 0.5 mL subcutaneously one time each week. After week 4 until changed..  The reason for changing the medication is because pt reached her goal weight.  The patient requests Send to preferred pharmacy.    Pt is requesting to lower the dose to 2.5 MG and to send to preferred pharmacy.  Please attach Sig: Inject 0.5 mL subcutaneously one time each week. After week 4 until changed.Sig: for insulin administration with Zepbound once weekly     Leah Campbell  PSR II  [facility] EXT [id number]    [facility]:  Select the preferred Pharmacy and route to MA pool.  MA:  Please pend prescription and route to provider].

## 2023-05-23 ENCOUNTER — Ambulatory Visit: Payer: Medicare Other | Admitting: Internal Medicine

## 2023-05-23 ENCOUNTER — Encounter: Payer: Self-pay | Admitting: Internal Medicine

## 2023-05-23 VITALS — BP 127/73 | HR 71 | Temp 97.7°F | Ht 64.0 in | Wt 129.4 lb

## 2023-05-23 DIAGNOSIS — Z13820 Encounter for screening for osteoporosis: Secondary | ICD-10-CM

## 2023-05-23 DIAGNOSIS — Z0000 Encounter for general adult medical examination without abnormal findings: Secondary | ICD-10-CM

## 2023-05-23 DIAGNOSIS — L570 Actinic keratosis: Secondary | ICD-10-CM

## 2023-05-23 DIAGNOSIS — R7301 Impaired fasting glucose: Secondary | ICD-10-CM

## 2023-05-23 DIAGNOSIS — Z780 Asymptomatic menopausal state: Secondary | ICD-10-CM

## 2023-05-23 NOTE — Patient Instructions (Addendum)
 Consider creatine 3-5 grams daily for muscle building sometimes helps with brain function.   Work   Reduce the tirzepatide  to 2.5mg  2-3 x per month weekly. For holidays can consider going back up.  Continue to work on weight lifting and balance exercise.    [phone] to schedule the dexa scan  Dietary recommedations (MIND diet/DASH diet):  Fish 1-2 times weekly  Berries/beans 3 x weekly at least (best berries are the purple/blue color)-berries are low glycemic for fruits  cruciferous vegetables decrease colon cancer risk (broccoli/cauliflower).   Include at least 2 vegetables daily one green and one Delmita or red.  Limit sugar  Limit or eliminate red meat  Avoid charring meat.  Lemon juice decreases carcinogen formation in barbequed meat. Consider putting in marinade.

## 2023-05-23 NOTE — Nursing Note (Signed)
 Patient identifiers completed via first/last name and DOB. Vital signs obtained, screened for pain. Verified allergies, tobacco history, pharmacy, and PHQ 2/9.      Jake Shark, MA II

## 2023-05-23 NOTE — Progress Notes (Unsigned)
 Answers submitted by the patient for this visit:  Patient Health Questionnaire (Submitted on 05/22/2023)  PHQ-2 Score:: 0  Health Risk Assessment (Submitted on 05/22/2023)  How often do you have a drink containing alcohol?: 3 - 2 to 3 times a week  How many drinks containing alcohol do you have on a typical day when you are drinking?: 0 - 1 or 2  How often do you have six or more drinks on one occasion?: 0 - never  PHQ-2 Score:: 0  PHQ-9 Score: : 0, 0    ASSESSMENT & PLAN    Assessment & Plan   (Z00.00) Medicare annual wellness visit, subsequent  (primary encounter diagnosis)  Reviewed diet  Reviewed need to keep up muscle bulk. Consider Creatine supplementation and need for light weight lifting.  Discussed she is at goal weight.  Plan to wean to every other week for now and can resume weekly over the holidays.  Plan: DEXA, COMPLETE            (R73.01) IFG (impaired fasting glucose)  Resolved with diet and Zepbound   Plan: Basic Metabolic Panel, Hemoglobin A1C      (L57.0) AK (actinic keratosis)  LN today- She was advised that the area might become erythematous and was instructed to avoid submerging the area in water for 48 hours or picking at it. Follow up with dermatology as scheduled. Will need biopsy or 5FU or other treatment if does not resolve.                    SUBJECTIVE      Leah Campbell is a 68yr old female who presents for her annual wellness exam.  Some hyperpigmentation noted right forehead and under right eye.   History of Present Illness  She is on Zepbound  2.5 mg weekly and has reached her goal weight. She is considering reducing the frequency due to muscle loss. To counteract muscle loss, she has increased gym activity. She is concerned about weight regain and muscle mass loss. She has a distant history of anorexia and knows she needs to be on guard about unrealistic goals. She receives positive feedback on her current weight but is cautious about weight gain and wonders if she should lose more (wonders  if 110 is a better goal).    In February, she experienced a fall due to tripping on a hidden step resulting in a head injury without LOC with persistent discoloration and a bump right side of head. No pain is present.    She has a lesion on her right cheek for a couple of years and has a dermatology appointment in June.  She also has other skin areas of concern.  Her diet includes berries, greens, tomatoes, nuts, and seeds. Her appetite has decreased since starting Zepbound , but she occasionally feels hungry on the 2.5 mg dose.     Medications Ordered Prior to Encounter[1]    Physical Activities: regular weight lifting and cardiovascular exercise.   Declines vaccines today  Declines shingrix.  Bad reaction to covid vaccine (giant swollen lymph nodes)         05/22/2023    10:34 AM   PHQ-2   Interest 0   Feeling 0   PHQ-2 Total Score 0        Patient-reported         Immunization History   Administered Date(s) Administered    COVID-19, mRNA, LNP-S, 30 mcg/0.3 mL (PFIZER) (GRAY CAP) (12 YRS & OLDER) 05/06/2020  COVID-19, mRNA, LNP-S, 30 mcg/0.3 mL (PFIZER) (PURPLE CAP) (12 YRS & OLDER) 02/12/2019, 03/04/2019, 11/12/2019    COVID-19, mRNA, LNP-S, Bivalent, 30 mcg/0.3 Ml (PFIZER) (GRAY CAP) (12 YRS AND OLDER) 12/02/2020    Tdap (ADACEL, BOOSTRIX) 08/03/2017     Past Medical History[2]     Functional ability /safety screening:  1.  Was the patients timed Up and Go test unsteady or longer than 30 seconds?  no    2.  Does the patient need help with the phone, transportation, shopping, preparing  Meals, housework, laundry, medications or managing money?       no    3.  Have you noticed any hearing difficulties?       no    4.  Have you noticed any vision difficulties?       no      ROS: Feeling well.  No dyspnea or chest pain on exertion. No neurological complaints. No abdominal pain, change in bowel habits, black or bloody stools. No noted breast lumps on self exam. No vaginal bleeding or pelvic pain. Skin changes noted.  Worried about bump and discoloration near right eye  OBJECTIVE      Her height is 1.626 m (5\' 4" ) and weight is 58.7 kg (129 lb 6.4 oz). Her temporal temperature is 36.5 C (97.7 F). Her blood pressure is 127/73 and her pulse is 71. Her oxygen saturation is 100%.   Body mass index is 22.21 kg/m.      Physical Exam  MEASUREMENTS: BMI- 22.0.   General Appearance: healthy, alert, no distress, pleasant affect, cooperative.  Eyes:  conjunctivae and corneas clear. PERRL, EOM's intact.   Ears:  normal TMs and canal.  Mouth: normal  Slightly reduced eyebrow raise right compared with left.   Neck:  Neck supple. No adenopathy, thyroid  symmetric, normal size.  Heart:  normal rate and regular rhythm, no murmurs  Lungs: clear to auscultation no chest deformities noted.  Breast exam: no dominant masses or nipple discharge  No enlarged axillary lymph nodes.  Abdomen: normal bowel sounds soft belly, non tender no palpable enlargement of liver or spleen  Extremities:  no cyanosis, clubbing, or edema. Intact pedal pulses  Intact deep tendon reflexes patellar/biceps bilaterally  Skin:  AK right lower cheek and 2 small Sks on left temple  residual hemosiderin related discoloration around right eye (picture after fall shows extensive bruising around eye)  Small knot right frontal head (1/2 cm or so)    Results  Procedure: Cryotherapy  Description: Liquid nitrogen was applied to the actinic keratosis on the right cheek in three rounds.  Informed Consent: The patient was informed of the risks, including pain and burning sensation, and the benefits of the procedure. Risk that may not be adequate treatment or rarely may blister         Advance Care Planning Optional    Patient was offered the opportunity to discuss advance care planning  yes  Does patient have an Advance Directive?      I obtained verbal consent from the patient to use AI ambient technology to transcribe the interactions between the patient and myself during the clinical  encounter.    Provider wore a surgical mask  Patient Instructions   Consider creatine 3-5 grams daily for muscle building sometimes helps with brain function.   Work   Reduce the tirzepatide  to 2.5mg  2-3 x per month weekly. For holidays can consider going back up.  Continue to work on weight lifting and  balance exercise.    [phone] to schedule the dexa scan  Dietary recommedations (MIND diet/DASH diet):  Fish 1-2 times weekly  Berries/beans 3 x weekly at least (best berries are the purple/blue color)-berries are low glycemic for fruits  cruciferous vegetables decrease colon cancer risk (broccoli/cauliflower).   Include at least 2 vegetables daily one green and one [place] or red.  Limit sugar  Limit or eliminate red meat  Avoid charring meat.  Lemon juice decreases carcinogen formation in barbequed meat. Consider putting in marinade.             Guerry Leek MD  [facility] Internal Medicine-Community Physician          [1]   Current Outpatient Medications on File Prior to Visit   Medication Sig Dispense Refill    Dietary Supplement Capsule When achy takes magnesium      drospirenone-estradiol  (ANGELIQ) 0.25-0.5 mg Tablet Takes just twice weekly      Fluoxetine  (PROZAC ) 20 mg tablet Take 2 tablets by mouth every morning.      Insulin  Syringe-Needle U-100 (B-D INS SYR ULTRAFINE 1CC/30G) 1 mL 30 gauge x 1/2" for insulin  administration with Zepbound  once weekly 50 each 1    tirzepatide , weight loss, (ZEPBOUND ) 2.5 mg/0.5 mL Solution Lower dose to Inject 2.5mg  once weekly - include syringes and needles to draw up solution 2 mL 11     No current facility-administered medications on file prior to visit.   [2]   Past Medical History:  Diagnosis Date    Anxiety 07/03/2008    Cholelithiasis 06/2020    with ruq pain    DM (diabetes mellitus), gestational     with IFG now    Fatigue

## 2023-05-24 ENCOUNTER — Encounter: Payer: Self-pay | Admitting: Internal Medicine

## 2023-05-26 ENCOUNTER — Ambulatory Visit: Payer: Medicare Other | Admitting: Internal Medicine

## 2023-06-03 ENCOUNTER — Telehealth: Payer: Self-pay

## 2023-06-03 NOTE — Telephone Encounter (Signed)
 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     Patient was contacted regarding Breast Cancer Screening    Refer to Flowsheet for outreach detail and information.    Please note if patient was contacted 3 patient identifiers were used.     Reviewed Mammogram Look back report.    Order for Mammogram is current in patient's chart.    [place]:  [address]  [phone]    [facility] Building: [facility]  [address] [place]  [phone]    [facility]: [place]  [address]  [phone]    [facility]:  [address] [place]  [place] [address]  [phone]    [facility]  [address]  [place]  [phone]    Woodson He, MA ll   Quality Improvement Team   Outreach - [facility]

## 2023-07-07 ENCOUNTER — Encounter: Payer: Self-pay | Admitting: Internal Medicine

## 2023-09-01 ENCOUNTER — Other Ambulatory Visit: Payer: Self-pay | Admitting: Internal Medicine

## 2023-09-01 DIAGNOSIS — E663 Overweight: Secondary | ICD-10-CM

## 2023-09-01 NOTE — Telephone Encounter (Signed)
 Medication Change:    Requesting a change in the prescription for   tirzepatide , weight loss, (ZEPBOUND ) 2.5 mg/0.5 mL Solution and the instructions are : Lower dose to Inject 2.5mg  once weekly - include syringes and needles to draw up solution .  The reason for changing the medication is because she would like to move up to ZEPBOUND  5 mg/0.5 mL Pen Injector .  The patient requests Send to preferred pharmacy.  [facility] [place], [place] - [address], [phone] [facility] [phone] [place] [phone]       Medication Change:    Requesting a change in the prescription for drospirenone-estradiol  (ANGELIQ) 0.25-0.5 mg Tablet and the instructions are : Takes just twice weekly .  The reason for changing the medication is because it is not on medicare formulary. Would like an alternative  The patient requests Send to preferred pharmacy.  [facility] [address], [place] - [address], [phone] [facility] [phone] [place] [phone]     It is going to 2 different pharmacy. Please advise. Thanks     Deniece Rankin  PCC,PSR II

## 2023-09-01 NOTE — Telephone Encounter (Signed)
 Recent Visits  Date Type Provider Dept   05/23/23 Office Visit Larwence, Wonda Hagedorn, MD [facility]   02/10/23 Office Visit Kordas, Wonda Hagedorn, MD [facility]   11/22/22 Office Visit Kordas, Wonda Hagedorn, MD [facility]   08/05/22 Office Visit Kordas, Wonda Hagedorn, MD [facility]   04/13/22 Office Visit Kordas, Wonda Hagedorn, MD [facility]   Showing recent visits within past 540 days with a meds authorizing provider and meeting all other requirements  Future Appointments  Date Type Provider Dept   11/24/23 Appointment Larwence, Karin Cathleen, MD [facility]   Showing future appointments within next 150 days with a meds authorizing provider and meeting all other requirements      Last Ordered: 02/11/23    Clotilda Flor, MA

## 2023-09-03 ENCOUNTER — Encounter: Payer: Self-pay | Admitting: Internal Medicine

## 2023-09-03 MED ORDER — ZEPBOUND 5 MG/0.5 ML SUBCUTANEOUS SOLUTION
SUBCUTANEOUS | 4 refills | Status: AC
Start: 2023-09-03 — End: 2023-12-26

## 2023-11-14 ENCOUNTER — Ambulatory Visit: Attending: Family Medicine

## 2023-11-14 DIAGNOSIS — R7301 Impaired fasting glucose: Secondary | ICD-10-CM | POA: Insufficient documentation

## 2023-11-14 LAB — HEMOGLOBIN A1C
Hgb A1C,Glucose Est Avg: 108 mg/dL
Hgb A1C: 5.4 % (ref 3.9–5.6)

## 2023-11-14 LAB — BASIC METABOLIC PANEL
Anion Gap: 10 mmol/L (ref 7–15)
Calcium: 8.8 mg/dL (ref 8.6–10.0)
Carbon Dioxide Total: 26 mmol/L (ref 22–29)
Chloride: 100 mmol/L (ref 98–107)
Creatinine Serum: 0.81 mg/dL (ref 0.51–1.17)
E-GFR Creatinine (Female): 80 mL/min/1.73m*2
Glucose: 90 mg/dL (ref 74–109)
Potassium: 3.9 mmol/L (ref 3.4–5.1)
Sodium: 136 mmol/L (ref 136–145)
Urea Nitrogen, Blood (BUN): 13 mg/dL (ref 6–20)

## 2023-11-24 ENCOUNTER — Ambulatory Visit: Admitting: Internal Medicine

## 2023-11-24 ENCOUNTER — Ambulatory Visit
Admission: RE | Admit: 2023-11-24 | Discharge: 2023-11-24 | Disposition: A | Discharge status: Home / Self Care | Source: Ambulatory Visit | Attending: Internal Medicine | Admitting: Internal Medicine

## 2023-11-24 VITALS — BP 124/75 | HR 79 | Temp 97.7°F | Ht 64.0 in | Wt 129.4 lb

## 2023-11-24 DIAGNOSIS — M25551 Pain in right hip: Secondary | ICD-10-CM

## 2023-11-24 MED ORDER — NORETHINDRONE ACETATE 0.5 MG-ETHINYL ESTRADIOL 2.5 MCG TABLET: 1.0000 | ORAL_TABLET | Freq: Every day | ORAL | 3 refills | Status: AC

## 2023-11-24 NOTE — Patient Instructions (Addendum)
 Change to fmhrt but take 2 x weekly. May need to adjust. Let me know if not well tolerated.   Can consider continuous glucose.

## 2023-11-24 NOTE — Nursing Note (Signed)
 Leah Campbell has been identified by name, and date of birth.  [name] is here with Patient     Patient's chief complaint noted.     I have verified and updated the following:  Allergy   Tobacco   Preferred pharmacy  PHQ-2/PHQ9       Health Maintenance(Care Gaps) reviewed with patient     Patient is due for Advanced Care Planning  - Place ACP documents on counter with green rooming sheet if due.  Provider will have the conversation    Patient is not due for A1C  - Complete POC A1C in clinic if due.  If patient is going to lab TODAY, okay to decline POC. Document reason if patient declines.      Patient is not due for Chlamydia Screen - Pend order from HM tab and collect 1st catch urine if due. If patient is here for a urinary symptom, please do not collect GC/Chlamydia sample as a clean catch sample is more appropriate for care.  Document if patient declined UA sample with the reason.      Patient is not due for Retinopathy Screen - Schedule or complete exam today, document if declined.  If completed, please request medical record and complete HM with date of completion per patient.    Vital signs documented in flowsheet.    Con Riding- [place], [place]

## 2023-11-24 NOTE — Progress Notes (Signed)
 Answers submitted by the patient for this visit:  Office Visit on 11/24/2023  9:30 AM with Wonda Hagedorn [place]  Other (Submitted on 11/22/2023)  Chief Complaint: OTHER CHECKLIST  Please describe your symptoms or the medical condition for which you are being seen: Follow up and need hormone replacement rx  Have you had these symptoms or been seen for this before?: Yes  Please list any medications you are currently taking for this condition.: Angelique  How long have you been having these symptoms or issue?: more than 1 year ago  Chief Complaint   Patient presents with    Medication Problem       ASSESSMENT & PLAN      Assessment & Plan  Right hip and thigh pain with decreased range of motion  Intermittent pain with decreased range of motion, positive hip grind test, and decreased external rotation suggest arthritis. Tenderness at gracilis insertion and iliotibial band indicates possible fascia herniation.  - Ordered right hip x-ray to evaluate for arthritis.  - Referred to osteopathic doctor for fascia manipulation.    Menopausal hormone therapy management  Current medication not covered by Medicare. Discussed alternatives, including FemHRT, which is on formulary and preferred for oral therapy. Transdermal options not preferred due to cancer risk.  - Switched to [facility], instructed to take twice weekly.  - Advised to monitor for tolerance and adjust dosage if necessary.  - Consider prior authorization for Angelique if [facility] not tolerated.            SUBJECTIVE      History of Present Illness  Leah Campbell is a 68 year old female who presents with right hip and leg discomfort.    She experiences stiffness in her right inner thigh and lower right leg, particularly during sleep and after physical activity. This began approximately one week ago without a specific inciting event. She has a history of her right side clicking when walking since her teenage years and occasional lower back stiffness. A pillow between her legs  during sleep provides some relief. No previous similar episodes of discomfort in the right leg or hip are noted.    She engages in yoga regularly to improve her flexibility, which has been limited for a long time. She recently participated in a yin yoga session, which she found beneficial for her hip flexors.  She uses 2.5mg  to 5mg  weekly.   She stops eating before full.     Past Medical History[1]  Past Surgical History[2]  Social History[3]  Medications Ordered Prior to Encounter[4]  OBJECTIVE      Her height is 1.626 m (5' 4) and weight is 58.7 kg (129 lb 6.4 oz). Her temporal temperature is 36.5 C (97.7 F). Her blood pressure is 124/75 and her pulse is 79. Her oxygen saturation is 100%.       Physical Exam  Well appearing   Heart is regular  MUSCULOSKELETAL: positive right hip grind and decreased internal/external rotation on the right. Tenderness over the right iliotibial band. Tenderness at the right gluteal muscle insertion site. Tenderness over the pelvis. Tenderness at the gracilis insertion.       Results  LABS  Hemoglobin A1c: 6.5% (11/17/2023)  Sodium: 140 mmol/L (11/17/2023)         I obtained verbal consent from the patient to use AI ambient technology to transcribe the interactions between the patient and myself during the clinical encounter.  Patient Instructions   Change to fmhrt but take 2 x  weekly. May need to adjust. Let me know if not well tolerated.   Can consider continuous glucose.      Wonda Hinds MD  [facility] Internal Medicine-Community Physician                      [1]   Past Medical History:  Diagnosis Date    Anxiety 07/03/2008    Cholelithiasis 06/2020    with ruq pain    DM (diabetes mellitus), gestational     with IFG now    Fatigue     Migraine March   [2]   Past Surgical History:  Procedure Laterality Date    NO SURGICAL HISTORY     [3]   Social History  Socioeconomic History    Marital status: MARRIED   Tobacco Use    Smoking status: Never     Passive exposure: Never     Smokeless tobacco: Never   Substance and Sexual Activity    Alcohol use: Yes     Alcohol/week: 4.0 standard drinks of alcohol     Types: 4 Glasses of wine per week     Comment: when goes out 3-4 glasses home sporatic    Drug use: Never    Sexual activity: Not Currently     Partners: Male     Birth control/protection: Post-menopausal     Social Drivers of Health     Physical Activity: Sufficiently Active (05/22/2023)    Exercise Vital Sign     Days of Exercise per Week: 4 days     Minutes of Exercise per Session: 60 min   Stress: No Stress Concern Present (05/22/2023)    [employer] of Occupational Health - Occupational Stress Questionnaire     Feeling of Stress : Not at all   Social Connections: Unknown (05/22/2023)    Social Connection and Isolation Panel     Frequency of Communication with Friends and Family: More than three times a week     Frequency of Social Gatherings with Friends and Family: More than three times a week   [4]   Current Outpatient Medications on File Prior to Visit   Medication Sig Dispense Refill    Dietary Supplement Capsule When achy takes magnesium      drospirenone-estradiol  (ANGELIQ) 0.25-0.5 mg Tablet Takes just twice weekly      Fluoxetine  (PROZAC ) 20 mg tablet Take 2 tablets by mouth every morning.      Insulin  Syringe-Needle U-100 (B-D INS SYR ULTRAFINE 1CC/30G) 1 mL 30 gauge x 1/2 for insulin  administration with Zepbound  once weekly 50 each 1    tirzepatide , weight loss, (ZEPBOUND ) 5 mg/0.5 mL Solution 5mg  sub-q once weekly- dispense vials with needles. 2 mL 4     No current facility-administered medications on file prior to visit.

## 2023-11-26 ENCOUNTER — Ambulatory Visit: Payer: Self-pay | Admitting: Internal Medicine

## 2023-12-26 ENCOUNTER — Encounter: Payer: Self-pay | Admitting: SPORTS MEDICINE

## 2023-12-26 NOTE — Progress Notes (Addendum)
 ASSESSMENT/PLAN:      ***    Pain level post-treatment:***    Further explanation and instructions on home exercise/muscle retraining exercises provided to patient at the end of visit as documented on the AVS.    Total time I spent in care of this patient today was *** minutes; procedure time was *** minutes.           Leah Campbell is a 68yr old female      HPI: Pt is presenting today with  TPPRESENTATION:[id number]::self as a referral from Kordas, Wonda Hagedorn, MD for *** and to determine if OMM treatment might be suitable for this patient and their condition.    Pain location and description of ailment: ***.   Mechanism of injury: ***.   Ailment has been present for: ***.   Exacerbated by: ***.   Previous treatment: ***.   Imaging completed: Xrays R hip.     Hand gesture about area of pain: ***.   Pain level upon arrival for treatment:***        This patient's problem list, past medical history, past surgical history, family history, social history and medication list were reviewed. In addition, all relevant notes and imaging pertinent to this presenting ailment were personally reviewed.       LMP 03/05/2018 (Within Months)         Physical Exam     Standing/seated flexion test: TPpositive/negative:41536::negative  .  Imaging reviewed: ***.       Progress notes authenticated by Zena DELENA Katos, DO 12/26/2023.

## 2023-12-27 ENCOUNTER — Encounter: Payer: Self-pay | Admitting: SPORTS MEDICINE

## 2023-12-27 ENCOUNTER — Ambulatory Visit: Admitting: SPORTS MEDICINE

## 2023-12-27 VITALS — BP 129/71 | HR 80 | Ht 65.0 in | Wt 128.0 lb

## 2023-12-27 DIAGNOSIS — R1031 Right lower quadrant pain: Secondary | ICD-10-CM

## 2023-12-27 DIAGNOSIS — M9906 Segmental and somatic dysfunction of lower extremity: Secondary | ICD-10-CM

## 2023-12-27 DIAGNOSIS — M25561 Pain in right knee: Secondary | ICD-10-CM

## 2023-12-27 DIAGNOSIS — M9905 Segmental and somatic dysfunction of pelvic region: Secondary | ICD-10-CM

## 2023-12-27 DIAGNOSIS — G8929 Other chronic pain: Secondary | ICD-10-CM

## 2023-12-27 NOTE — Patient Instructions (Signed)
 Fascial Distortion Model Post-Treatment Instructions      You have just had intense fascial treatment done today to help reduce your pain and restore normal function. Fascia is the connective tissue located under the skin throughout the body that wraps the bones, nerves and muscles to ensure integrity and normal body function. Given the intense pressure used during the treatment you may experience subsequent redness, soreness and even bruising at the treatment area(s). This is normal and can last up to a week or more (though typically less).    After the treatment today it is advised that you do the following:    Ice the area (apply ice to the affected area while using a barrier (I.e., towel) between the ice and your skin to prevent frostbite. Apply for approx. 10-15 minutes per application and can repeat 2-3x/day as needed.) Also if needed you can also use tylenol or Advil/Motrin/naproxen, etc. For any ongoing soreness so long as it is safe based on your liver and kidney function.    2.  Keep the area loose by performing the recommended motions we discussed or simply going for a prolonged walk for 20 minutes.    3.  It is important that you avoid alcohol post-treatment and simply hydrate with plenty of water to ensure your urine is a clear color. This helps restore normal fascial glide.     If there is any severe worsening of the pain (which is highly unlikely) please follow up with me at the next available appointment.    I also advise performing the following stretches: Gentle movements of the areas treated x 48 hours then stretches as suggested, hip rotations (gentle motions with hand support), butterfly (groin) stretching, and ankle rotations    Lastly, due to the amount of time I typically require to perform the correction(s) needed, I ask that ALL patients arrive 15 minutes prior to their appointment time to allow for the needed rooming process and be available in the room BY THE SCHEDULED APPOINTMENT TIME.  Thank you for this consideration and allowing me the maximal time to work out your ailment(s).               Diann Bangerter A Ariez Neilan, DO

## 2023-12-27 NOTE — Nursing Note (Signed)
 Patients vitals obtained, allergies verified, pharmacy verified, and patient screened for pain. Contact precautions were followed when caring for the patient.

## 2024-01-25 NOTE — Progress Notes (Unsigned)
 ASSESSMENT/PLAN:      ***    New exercises provided to patient for home use:  TPYES:[id number]::Yes  New medications prescribed today:  TPYESNO:33827::No  New imaging ordered today:  TPYESNO:33827::No    Pain level post-procedure:***.    Modified home exercise/muscle retraining exercises provided to patient at the end of visit as documented on the AVS.    Total time I spent in care of this patient today was *** minutes; procedure time was *** minutes.                HPI: Leah Campbell  is a 69yr old female presenting today with self, here as a follow up from prior appointment on: 12/27/23.   Interim symptom history since last visit: ***.     New/change in symptoms reported since the prior visit:***.    Improvement noted from prior visit:  TPYES:[id number]::Yes  Any injury sustained since last visit:  TPYESNO:33827::No    Pain level today:***    Patient consents to further OMM treatment today.        LMP 03/05/2018 (Within Months)         Physical Exam       Standing/seating flexion test:  TPpositive/negative:[id number]::negative   All recent studies ordered by me reviewed with patient.      Progress notes authenticated by Oris Calmes A Illias Pantano, DO 01/25/2024.

## 2024-01-26 ENCOUNTER — Ambulatory Visit: Admitting: SPORTS MEDICINE

## 2024-01-26 DIAGNOSIS — M9906 Segmental and somatic dysfunction of lower extremity: Secondary | ICD-10-CM

## 2024-01-26 DIAGNOSIS — R1031 Right lower quadrant pain: Secondary | ICD-10-CM

## 2024-01-26 DIAGNOSIS — M79604 Pain in right leg: Secondary | ICD-10-CM

## 2024-01-26 DIAGNOSIS — M9905 Segmental and somatic dysfunction of pelvic region: Secondary | ICD-10-CM

## 2024-01-26 NOTE — Nursing Note (Signed)
 Patient identified by last name and birthday. Pharmacy and allergies verified. Vital signs taken.  Bernarda Servant, MA

## 2024-01-26 NOTE — Patient Instructions (Signed)
 Fascial Distortion Model Post-Treatment Instructions      You have just had intense fascial treatment done today to help reduce your pain and restore normal function. Fascia is the connective tissue located under the skin throughout the body that wraps the bones, nerves and muscles to ensure integrity and normal body function. Given the intense pressure used during the treatment you may experience subsequent redness, soreness and even bruising at the treatment area(s). This is normal and can last up to a week or more (though typically less).    After the treatment today it is advised that you do the following:    Ice the area (apply ice to the affected area while using a barrier (I.e., towel) between the ice and your skin to prevent frostbite. Apply for approx. 10-15 minutes per application and can repeat 2-3x/day as needed.) Also if needed you can also use tylenol  or Advil/Motrin/naproxen , etc. For any ongoing soreness so long as it is safe based on your liver and kidney function.    2.  Keep the area loose by performing the recommended motions we discussed or simply going for a prolonged walk for 20 minutes.    3.  It is important that you avoid alcohol post-treatment and simply hydrate with plenty of water  to ensure your urine is a clear color. This helps restore normal fascial glide.     If there is any severe worsening of the pain (which is highly unlikely) please follow up with me at the next available appointment.    I also advise performing the following stretches: Gentle movements of the areas treated x 48 hours then stretches as suggested and hip rotations (gentle motions with hand support)    Lastly, due to the amount of time I typically require to perform the correction(s) needed, I ask that ALL patients arrive 15 minutes prior to their appointment time to allow for the needed rooming process and be available in the room BY THE SCHEDULED APPOINTMENT TIME. Thank you for this consideration and allowing me  the maximal time to work out your ailment(s).               Chesley Veasey A Avir Deruiter, DO

## 2024-01-27 ENCOUNTER — Telehealth: Payer: Self-pay | Admitting: Internal Medicine

## 2024-01-27 MED ORDER — OSELTAMIVIR 75 MG CAPSULE: 75.0000 mg | ORAL_CAPSULE | Freq: Two times a day (BID) | ORAL | 0 refills | Status: AC

## 2024-01-27 NOTE — Telephone Encounter (Signed)
 Still take a flu test but I agree there can be a lag on the home test. Sent in tamiflu . Dylan Ruotolo MD

## 2024-01-27 NOTE — Telephone Encounter (Signed)
 Can she take a home flu test? Please clarify exposure. Crystalynn Mcinerney MD

## 2024-01-27 NOTE — Telephone Encounter (Signed)
 I called the patient, she will be able to take a home flu test.  1 week ago her grandson who had, influenza type A, she was around him.  Husband is being treated for lymphoma and she wants to start treatment as soon as possible.  The patient stated that she has a sore throat and achiness that started in the middle of the night last night.  Is there a downside to her taking the tamiflu  even if she tests negative.

## 2024-01-27 NOTE — Telephone Encounter (Signed)
 Forwarded to MD for review/response

## 2024-01-27 NOTE — Telephone Encounter (Signed)
 General Advice / Message to MD:    Requesting advice about  Sore throat and was exposed to Flu type A. Patient states husband has lymphoma.   The problem started 1 day(s) ago, and is constant.  She has tried Lozenges  She requests a prescription called in for Tamiflu      Offered to transfer caller to Nursing Triage: Declined      Corean Ada   [facility] PSR II     [facility]:  Please obtain basic information such as duration (how long have you had this problem?), location (Where is the pain?), Route to MA pool].

## 2024-01-27 NOTE — Telephone Encounter (Signed)
 3 patient identifiers verified by the patient    I reviewed PCP's message and the patient verbally stated understanding of the message.   Patient will pick up a home Flu test from pharmacy, still has mild body aches and congestion, not feeling well.   Patient will pick up medication from pharmacy.  All questions answered.    I advised the patient to call the [facility] PCN Triage advice line with any questions or concerns.    Ferdie Gale BSN, RN  PCN Triage

## 2024-02-15 NOTE — Progress Notes (Unsigned)
 ASSESSMENT/PLAN:      ***    New exercises provided to patient for home use:  [id number]::Yes  New medications prescribed today:  [id number]::No  New imaging ordered today:  TPYESNO:[id number]::No    Pain level post-procedure:***.    Modified home exercise/muscle retraining exercises provided to patient at the end of visit as documented on the AVS.    Total time I spent in care of this patient today was *** minutes; procedure time was *** minutes.                HPI: Leah Campbell  is a 69yr old female presenting today with self, here as a follow up from prior appointment on: 01/26/24.   Interim symptom history since last visit: ***.     New/change in symptoms reported since the prior visit:***.    Improvement noted from prior visit:  [id number]::Yes  Any injury sustained since last visit:  [id number]::No    Pain level today:***    Patient consents to further OMM treatment today.        LMP 03/05/2018 (Within Months)         Physical Exam       Standing/seating flexion test:  TPpositive/negative:[id number]::negative   All recent studies ordered by me reviewed with patient.      Progress notes authenticated by Zena DELENA Katos, DO 02/15/2024.

## 2024-02-16 ENCOUNTER — Ambulatory Visit: Admitting: SPORTS MEDICINE

## 2024-02-23 ENCOUNTER — Ambulatory Visit: Admitting: SPORTS MEDICINE

## 2024-02-23 ENCOUNTER — Encounter: Payer: Self-pay | Admitting: SPORTS MEDICINE

## 2024-02-23 VITALS — BP 135/71 | HR 79 | Temp 97.7°F | Ht 65.0 in | Wt 127.0 lb

## 2024-02-23 DIAGNOSIS — M79604 Pain in right leg: Secondary | ICD-10-CM

## 2024-02-23 DIAGNOSIS — M9906 Segmental and somatic dysfunction of lower extremity: Secondary | ICD-10-CM

## 2024-02-23 DIAGNOSIS — M9905 Segmental and somatic dysfunction of pelvic region: Secondary | ICD-10-CM

## 2024-02-23 DIAGNOSIS — R1031 Right lower quadrant pain: Secondary | ICD-10-CM

## 2024-02-23 NOTE — Patient Instructions (Signed)
 Fascial Distortion Model Post-Treatment Instructions      You have just had intense fascial treatment done today to help reduce your pain and restore normal function. Fascia is the connective tissue located under the skin throughout the body that wraps the bones, nerves and muscles to ensure integrity and normal body function. Given the intense pressure used during the treatment you may experience subsequent redness, soreness and even bruising at the treatment area(s). This is normal and can last up to Leah week or more (though typically less).    After the treatment today it is advised that you do the following:    Ice the area (apply ice to the affected area while using Leah barrier (I.e., towel) between the ice and your skin to prevent frostbite. Apply for approx. 10-15 minutes per application and can repeat 2-3x/day as needed.) Also if needed you can also use tylenol or Advil/Motrin/naproxen, etc. For any ongoing soreness so long as it is safe based on your liver and kidney function.    2.  Keep the area loose by performing the recommended motions we discussed or simply going for Leah prolonged walk for 20 minutes.    3.  It is important that you avoid alcohol post-treatment and simply hydrate with plenty of water to ensure your urine is Leah clear color. This helps restore normal fascial glide.     If there is any severe worsening of the pain (which is highly unlikely) please follow up with me at the next available appointment.    I also advise performing the following stretches: Gentle movements of the areas treated x 48 hours then stretches as suggested, hip rotations (gentle motions with hand support), and ankle rotations    Try new insoles with better gel support and arches. Spenco insoles are good    Lastly, due to the amount of time I typically require to perform the correction(s) needed, I ask that ALL patients arrive 15 minutes prior to their appointment time to allow for the needed rooming process and be available  in the room BY THE SCHEDULED APPOINTMENT TIME. Thank you for this consideration and allowing me the maximal time to work out your ailment(s).               Leah Campbell Leah Corbin Hott, DO

## 2024-02-23 NOTE — Nursing Note (Signed)
 Leah Campbell has been identified by name, and date of birth.  [name] is here with Patient     Patient's chief complaint noted.     I have verified and updated the following:  Allergy   Tobacco   Preferred pharmacy    Vital signs documented in flowsheet.    Per MD, POC HEMOGLOBIN A1C performed, if due. Results entered and given to provider.

## 2024-02-23 NOTE — Progress Notes (Signed)
 ASSESSMENT/PLAN:      (R10.31) Right groin pain  (primary encounter diagnosis)  Comment: R deep inguinal pinching sensation-Chronic, not at goal.  Plan: Pt has done well with the prior FDM/OMM work applied thus far. Would like to proceed with further treatment at this point to help work out any residual issues present.      (M79.604) Right leg pain  Comment: R lateral hip to lateral leg restrictive pain-Chronic, not at goal.    Plan: Pt has done well with the prior FDM/OMM work applied thus far. Would like to proceed with further treatment at this point to help work out any residual issues present.        PROCEDURE -    (M99.05) Somatic dysfunction of pelvic region  Comment:   Plan: Osteopathic Manip, 1-2 Body Regions        After informed consent was obtained, correction was procured through the use of fascial distortion model techniques applied to the dysfunctional segment(s) with subsequent improvement noted. Pt illustrated improved ROM with reduced pain on repeat testing/questioning. Patient was then advised on proper aftercare including the use of moist heat vs icing and appropriate stretches based on the underlying ailment.       (M99.06) Somatic dysfunction of lower extremity  Comment:   Plan: Osteopathic Manip, 1-2 Body Regions        After informed consent was obtained, correction was procured through the use of fascial distortion model techniques applied to the dysfunctional segment(s) with subsequent improvement noted. Pt illustrated improved ROM with reduced pain on repeat testing/questioning. Patient was then advised on proper aftercare including the use of moist heat vs icing and appropriate stretches based on the underlying ailment.           New exercises provided to patient for home use: Yes- also discussed appropriate shoe wear and recommendations for insoles  New medications prescribed today: No  New imaging ordered today: No    Pain level post-procedure:0/10.    Modified home exercise/muscle  retraining exercises provided to patient at the end of visit as documented on the AVS.    Total time I spent in care of this patient today was 30 minutes; procedure time was 10 minutes.                HPI: Leah Campbell  is a 69yr old female presenting today with self, here as a follow up from prior appointment on: 01/26/24.   Interim symptom history since last visit: she is noting that the prior OMM treatment helped and things were resolved until about 1 week ago. She is noting that the pain is coming on about 1 day after her prolonged walking. No pain during the walking.     New/change in symptoms reported since the prior visit:line of sharp an medial deep aching in the groin. Feels weak in the groin as well    Improvement noted from prior visit: Yes  Any injury sustained since last visit: No    Pain level today:2/10    Patient consents to further OMM treatment today.        BP 135/71 (SITE: left arm, Orthostatic Position: sitting, Cuff Size: regular)   Pulse 79   Temp 36.5 C (97.7 F) (Tympanic)   Ht 1.651 m (5' 5)   Wt 57.6 kg (127 lb)   LMP 03/05/2018 (Within Months)   SpO2 97%   BMI 21.13 kg/m         Physical Exam  Vitals reviewed.  Constitutional:       Appearance: Normal appearance. She is normal weight. She is not ill-appearing.   HENT:      Head: Normocephalic and atraumatic.   Cardiovascular:      Rate and Rhythm: Normal rate and regular rhythm.      Pulses: Normal pulses.   Pulmonary:      Effort: Pulmonary effort is normal. No respiratory distress.      Breath sounds: Normal breath sounds. No stridor. No wheezing or rhonchi.   Musculoskeletal:        Legs:    Neurological:      Mental Status: She is alert.            Standing/seating flexion test: negative   All recent studies ordered by me reviewed with patient.      Progress notes authenticated by Claressa Hughley A Elaine Roanhorse, DO 02/23/2024.

## 2024-03-13 ENCOUNTER — Ambulatory Visit: Admitting: SPORTS MEDICINE

## 2024-03-22 ENCOUNTER — Ambulatory Visit: Admitting: Internal Medicine
